# Patient Record
Sex: Female | Born: 1985 | Race: White | Hispanic: No | Marital: Single | Smoking: Former smoker
Health system: Southern US, Community
[De-identification: ages and names within clinical notes are randomized; demographics above are authoritative.]

## PROBLEM LIST (undated history)

## (undated) DIAGNOSIS — F3164 Bipolar disorder, current episode mixed, severe, with psychotic features: Secondary | ICD-10-CM

## (undated) DIAGNOSIS — I10 Essential (primary) hypertension: Secondary | ICD-10-CM

## (undated) DIAGNOSIS — F4310 Post-traumatic stress disorder, unspecified: Secondary | ICD-10-CM

## (undated) HISTORY — PX: INNER EAR SURGERY: SHX679

## (undated) HISTORY — PX: TONSILECTOMY, ADENOIDECTOMY, BILATERAL MYRINGOTOMY AND TUBES: SHX2538

## (undated) HISTORY — PX: OTHER SURGICAL HISTORY: SHX169

---

## 2014-10-18 ENCOUNTER — Encounter: Payer: Self-pay | Admitting: *Deleted

## 2014-10-18 ENCOUNTER — Emergency Department
Admission: EM | Admit: 2014-10-18 | Discharge: 2014-10-19 | Disposition: A | Discharge status: Other Acute Inpatient Hospital | Payer: Medicaid Other | Attending: Emergency Medicine | Admitting: Emergency Medicine

## 2014-10-18 DIAGNOSIS — F4310 Post-traumatic stress disorder, unspecified: Secondary | ICD-10-CM | POA: Insufficient documentation

## 2014-10-18 DIAGNOSIS — F29 Unspecified psychosis not due to a substance or known physiological condition: Secondary | ICD-10-CM | POA: Diagnosis not present

## 2014-10-18 DIAGNOSIS — F1210 Cannabis abuse, uncomplicated: Secondary | ICD-10-CM | POA: Diagnosis not present

## 2014-10-18 DIAGNOSIS — Z3202 Encounter for pregnancy test, result negative: Secondary | ICD-10-CM | POA: Diagnosis not present

## 2014-10-18 DIAGNOSIS — F911 Conduct disorder, childhood-onset type: Secondary | ICD-10-CM | POA: Diagnosis present

## 2014-10-18 DIAGNOSIS — Z87891 Personal history of nicotine dependence: Secondary | ICD-10-CM | POA: Insufficient documentation

## 2014-10-18 DIAGNOSIS — F23 Brief psychotic disorder: Secondary | ICD-10-CM

## 2014-10-18 HISTORY — DX: Post-traumatic stress disorder, unspecified: F43.10

## 2014-10-18 LAB — CBC WITH DIFFERENTIAL/PLATELET
BASOS ABS: 0 10*3/uL (ref 0–0.1)
Basophils Relative: 0 %
EOS ABS: 0.1 10*3/uL (ref 0–0.7)
Eosinophils Relative: 1 %
HCT: 42.1 % (ref 35.0–47.0)
Hemoglobin: 13.9 g/dL (ref 12.0–16.0)
LYMPHS PCT: 27 %
Lymphs Abs: 3.2 10*3/uL (ref 1.0–3.6)
MCH: 28.5 pg (ref 26.0–34.0)
MCHC: 33 g/dL (ref 32.0–36.0)
MCV: 86.1 fL (ref 80.0–100.0)
Monocytes Absolute: 0.8 10*3/uL (ref 0.2–0.9)
Monocytes Relative: 6 %
NEUTROS ABS: 7.8 10*3/uL — AB (ref 1.4–6.5)
Neutrophils Relative %: 66 %
PLATELETS: 260 10*3/uL (ref 150–440)
RBC: 4.89 MIL/uL (ref 3.80–5.20)
RDW: 15.1 % — ABNORMAL HIGH (ref 11.5–14.5)
WBC: 11.9 10*3/uL — AB (ref 3.6–11.0)

## 2014-10-18 LAB — COMPREHENSIVE METABOLIC PANEL
ALBUMIN: 4.6 g/dL (ref 3.5–5.0)
ALK PHOS: 77 U/L (ref 38–126)
ALT: 21 U/L (ref 14–54)
AST: 24 U/L (ref 15–41)
Anion gap: 7 (ref 5–15)
BILIRUBIN TOTAL: 0.5 mg/dL (ref 0.3–1.2)
BUN: 11 mg/dL (ref 6–20)
CHLORIDE: 105 mmol/L (ref 101–111)
CO2: 27 mmol/L (ref 22–32)
Calcium: 9.4 mg/dL (ref 8.9–10.3)
Creatinine, Ser: 0.73 mg/dL (ref 0.44–1.00)
GLUCOSE: 106 mg/dL — AB (ref 65–99)
POTASSIUM: 3.5 mmol/L (ref 3.5–5.1)
Sodium: 139 mmol/L (ref 135–145)
Total Protein: 7.3 g/dL (ref 6.5–8.1)

## 2014-10-18 LAB — URINALYSIS COMPLETE WITH MICROSCOPIC (ARMC ONLY)
Bilirubin Urine: NEGATIVE
Glucose, UA: NEGATIVE mg/dL
HGB URINE DIPSTICK: NEGATIVE
KETONES UR: NEGATIVE mg/dL
Leukocytes, UA: NEGATIVE
Nitrite: NEGATIVE
Protein, ur: NEGATIVE mg/dL
Specific Gravity, Urine: 1.026 (ref 1.005–1.030)
pH: 6 (ref 5.0–8.0)

## 2014-10-18 LAB — POCT PREGNANCY, URINE: PREG TEST UR: NEGATIVE

## 2014-10-18 LAB — URINE DRUG SCREEN, QUALITATIVE (ARMC ONLY)
Amphetamines, Ur Screen: NOT DETECTED
Barbiturates, Ur Screen: NOT DETECTED
Benzodiazepine, Ur Scrn: NOT DETECTED
CANNABINOID 50 NG, UR ~~LOC~~: POSITIVE — AB
COCAINE METABOLITE, UR ~~LOC~~: NOT DETECTED
MDMA (Ecstasy)Ur Screen: NOT DETECTED
Methadone Scn, Ur: NOT DETECTED
Opiate, Ur Screen: NOT DETECTED
PHENCYCLIDINE (PCP) UR S: NOT DETECTED
Tricyclic, Ur Screen: NOT DETECTED

## 2014-10-18 LAB — ETHANOL: Alcohol, Ethyl (B): <5 mg/dL (ref <5)
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
## 2014-10-18 NOTE — ED Notes (Signed)

## 2014-10-18 NOTE — ED Notes (Addendum)
Patient resting comfortably in bed, denies pain or other complaints at this time. Patient calm and cooperative, NAD noted. Patient verbalized contract for safety. Will continue to monitor.

## 2014-10-18 NOTE — ED Notes (Signed)
Patient resting comfortably in bed, watching TV. NAD noted. Patient alert, calm, and cooperative. Will continue to monitor.

## 2014-10-18 NOTE — ED Notes (Signed)
Patient assigned to appropriate care area. Patient oriented to unit/care area: Informed that, for their safety, care areas are designed for safety and monitored by security cameras at all times; and visiting hours explained to patient. Patient verbalizes understanding, and verbal contract for safety obtained. 

## 2014-10-18 NOTE — ED Notes (Signed)

## 2014-10-18 NOTE — ED Notes (Signed)

## 2014-10-18 NOTE — ED Notes (Addendum)
Patient verbalized permission to speak with Briant Sites about patient condition. Briant Sites contact information: Cell #: [phone]

## 2014-10-18 NOTE — ED Notes (Signed)
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
## 2014-10-19 ENCOUNTER — Inpatient Hospital Stay
Admission: EM | Admit: 2014-10-19 | Discharge: 2014-10-27 | DRG: 885 | Disposition: A | Discharge status: Home / Self Care | Payer: Medicaid Other | Attending: Psychiatry | Admitting: Psychiatry

## 2014-10-19 ENCOUNTER — Encounter: Payer: Self-pay | Admitting: *Deleted

## 2014-10-19 DIAGNOSIS — Z9889 Other specified postprocedural states: Secondary | ICD-10-CM | POA: Diagnosis not present

## 2014-10-19 DIAGNOSIS — F302 Manic episode, severe with psychotic symptoms: Secondary | ICD-10-CM | POA: Diagnosis present

## 2014-10-19 DIAGNOSIS — F312 Bipolar disorder, current episode manic severe with psychotic features: Secondary | ICD-10-CM | POA: Diagnosis not present

## 2014-10-19 DIAGNOSIS — I10 Essential (primary) hypertension: Secondary | ICD-10-CM | POA: Diagnosis present

## 2014-10-19 DIAGNOSIS — K5900 Constipation, unspecified: Secondary | ICD-10-CM | POA: Diagnosis present

## 2014-10-19 DIAGNOSIS — Z87891 Personal history of nicotine dependence: Secondary | ICD-10-CM | POA: Diagnosis not present

## 2014-10-19 DIAGNOSIS — F319 Bipolar disorder, unspecified: Secondary | ICD-10-CM | POA: Diagnosis present

## 2014-10-19 DIAGNOSIS — F1220 Cannabis dependence, uncomplicated: Secondary | ICD-10-CM | POA: Diagnosis present

## 2014-10-19 DIAGNOSIS — E669 Obesity, unspecified: Secondary | ICD-10-CM | POA: Diagnosis present

## 2014-10-19 DIAGNOSIS — F29 Unspecified psychosis not due to a substance or known physiological condition: Secondary | ICD-10-CM | POA: Diagnosis not present

## 2014-10-19 LAB — LIPID PANEL
CHOL/HDL RATIO: 5.1 ratio
CHOLESTEROL: 175 mg/dL (ref 0–200)
HDL: 34 mg/dL — ABNORMAL LOW (ref >40)
LDL Cholesterol: 116 mg/dL — ABNORMAL HIGH (ref 0–99)
Triglycerides: 126 mg/dL (ref <150)
VLDL: 25 mg/dL (ref 0–40)

## 2014-10-19 LAB — TSH: TSH: 1.854 u[IU]/mL (ref 0.350–4.500)

## 2014-10-19 MED ORDER — MAGNESIUM HYDROXIDE 400 MG/5ML PO SUSP
30.0000 mL | Freq: Every day | ORAL | Status: DC | PRN
  Administered 2014-10-24 – 2014-10-25 (×2): 30 mL via ORAL
  Filled 2014-10-19 (×2): qty 30

## 2014-10-19 MED ORDER — ALUM & MAG HYDROXIDE-SIMETH 200-200-20 MG/5ML PO SUSP
30.0000 mL | ORAL | Status: DC | PRN
Start: 2014-10-19 — End: 2014-10-27

## 2014-10-19 MED ORDER — LISINOPRIL 20 MG PO TABS
10.0000 mg | ORAL_TABLET | Freq: Every day | ORAL | Status: DC
  Administered 2014-10-19: 10 mg via ORAL

## 2014-10-19 MED ORDER — ACETAMINOPHEN 325 MG PO TABS
650.0000 mg | ORAL_TABLET | Freq: Four times a day (QID) | ORAL | Status: DC | PRN
  Administered 2014-10-24 – 2014-10-25 (×2): 650 mg via ORAL
  Filled 2014-10-19 (×3): qty 2

## 2014-10-19 MED ORDER — LITHIUM CARBONATE ER 300 MG PO TBCR
600.0000 mg | EXTENDED_RELEASE_TABLET | Freq: Every day | ORAL | Status: DC
  Administered 2014-10-19 – 2014-10-20 (×2): 600 mg via ORAL
  Filled 2014-10-19 (×3): qty 2

## 2014-10-19 MED ORDER — LISINOPRIL 20 MG PO TABS
10.0000 mg | ORAL_TABLET | Freq: Every day | ORAL | Status: DC
  Administered 2014-10-20: 10 mg via ORAL
  Filled 2014-10-19: qty 1

## 2014-10-19 MED ORDER — LITHIUM CARBONATE ER 300 MG PO TBCR: 600.0000 mg | EXTENDED_RELEASE_TABLET | Freq: Every day | ORAL | Status: DC

## 2014-10-19 MED ORDER — LISINOPRIL 10 MG PO TABS
ORAL_TABLET | ORAL | Status: AC
  Filled 2014-10-19: qty 1

## 2014-10-19 NOTE — ED Notes (Signed)

## 2014-10-19 NOTE — ED Notes (Signed)
Patient resting comfortably in bed with lights turned off for patient comfort. Respirations even and unlabored, NAD noted at this time.

## 2014-10-19 NOTE — ED Notes (Signed)
BEHAVIORAL HEALTH ROUNDING Patient sleeping: No. Patient alert and oriented: yes Behavior appropriate: Yes.  ; If no, describe:  Nutrition and fluids offered: Yes  Toileting and hygiene offered: Yes  Sitter present: yes Law enforcement present: Yes   Pt returns to ed room 25 from shower. Awaiting psych consult.

## 2014-10-19 NOTE — ED Notes (Signed)

## 2014-10-19 NOTE — ED Notes (Signed)
BEHAVIORAL HEALTH ROUNDING Patient sleeping: Yes.   Patient alert and oriented: yes Behavior appropriate: Yes.  ; If no, describe:  Nutrition and fluids offered: Yes  Toileting and hygiene offered: Yes  Sitter present: yes Law enforcement present: Yes  

## 2014-10-19 NOTE — [facility] Group Notes (Signed)
[facility] Group Notes:  (Nursing/MHT/Case Management/Adjunct)  Date:  10/19/2014  Time:  10:55 PM  Type of Therapy:  Group Therapy  Participation Level:  Active  Participation Quality:  Appropriate  Affect:  Appropriate  Cognitive:  Appropriate  Insight:  Appropriate  Engagement in Group:  Engaged  Modes of Intervention:  n/a  Summary of Progress/Problems:  Jocelyn Martin 10/19/2014, 10:55 PM

## 2014-10-19 NOTE — ED Notes (Signed)

## 2014-10-19 NOTE — ED Notes (Signed)
Patient resting comfortably in bed, lights and TV turned off for comfort. Respirations even and unlabored, patient denies any complaints at this time. NAD noted. Will continue to monitor.

## 2014-10-19 NOTE — ED Notes (Signed)
Pt resting at this time. Still awaiting to see psych md.

## 2014-10-19 NOTE — Tx Team (Signed)
Initial Interdisciplinary Treatment Plan   PATIENT STRESSORS: Financial difficulties Marital or family conflict   PATIENT STRENGTHS: Wellsite geologist fund of knowledge   PROBLEM LIST: Problem List/Patient Goals Date to be addressed Date deferred Reason deferred Estimated date of resolution  Psychosis      Substance Abuse                                                 DISCHARGE CRITERIA:  Improved stabilization in mood, thinking, and/or behavior Motivation to continue treatment in a less acute level of care  PRELIMINARY DISCHARGE PLAN: Outpatient therapy Return to previous living arrangement  PATIENT/FAMIILY INVOLVEMENT: This treatment plan has been presented to and reviewed with the patient, Jocelyn Martin.  The patient and family have been given the opportunity to ask questions and make suggestions.  Jocelyn Martin, Sarajane Marek 10/19/2014, 6:19 PM

## 2014-10-19 NOTE — Plan of Care (Signed)
Problem: Ineffective individual coping Goal: STG: Patient will remain free from self harm Outcome: Progressing Pt safe on the unit     
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
## 2014-10-19 NOTE — ED Notes (Signed)
Awaiting bed assignment in the BHU downstairs.
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
## 2014-10-19 NOTE — ED Notes (Signed)
Patient resting comfortably in bed, lights turned off for patient comfort. Will continue to monitor. NAD at this time.

## 2014-10-19 NOTE — ED Notes (Addendum)
BEHAVIORAL HEALTH ROUNDING Patient sleeping: No. Patient alert and oriented: yes Behavior appropriate: Yes.  ; If no, describe:  Nutrition and fluids offered: Yes  Toileting and hygiene offered: Yes  Sitter present: yes Law enforcement present: Yes   Pt given lunch tray.  Dr Toni Amend in to see pt at this time.

## 2014-10-19 NOTE — ED Notes (Signed)
Visitor at bedside with security monitoring.

## 2014-10-19 NOTE — ED Notes (Signed)
Pt requests taking a shower today. Will arrange for pt to go to [facility] to take shower.

## 2014-10-19 NOTE — ED Notes (Signed)
BEHAVIORAL HEALTH ROUNDING Patient sleeping: No. Patient alert and oriented: yes Behavior appropriate: Yes.  ; If no, describe:  Nutrition and fluids offered: Yes  Toileting and hygiene offered: Yes  Sitter present: yes Law enforcement present: Yes  

## 2014-10-19 NOTE — ED Notes (Signed)
Pt escorted over to [facility] to shower via Psychologist, counselling. Pt is under IVC.

## 2014-10-19 NOTE — ED Notes (Signed)
Report called to [employer] in [place] med unit.

## 2014-10-19 NOTE — ED Notes (Signed)
Patient resting comfortably in bed, lights and TV turned off for patient comfort. Respirations even and unlabored, calm and cooperative, no complaints at this time. NAD noted. Will continue to monitor.
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
## 2014-10-20 ENCOUNTER — Inpatient Hospital Stay: Payer: Medicaid Other

## 2014-10-20 DIAGNOSIS — F1220 Cannabis dependence, uncomplicated: Secondary | ICD-10-CM

## 2014-10-20 DIAGNOSIS — I10 Essential (primary) hypertension: Secondary | ICD-10-CM

## 2014-10-20 LAB — HEMOGLOBIN A1C: Hgb A1c MFr Bld: 5.2 % (ref 4.0–6.0)

## 2014-10-20 MED ORDER — ARIPIPRAZOLE 10 MG PO TABS
10.0000 mg | ORAL_TABLET | Freq: Every day | ORAL | Status: DC
  Administered 2014-10-20 – 2014-10-21 (×2): 10 mg via ORAL
  Filled 2014-10-20 (×2): qty 1

## 2014-10-20 MED ORDER — AMLODIPINE BESYLATE 5 MG PO TABS
5.0000 mg | ORAL_TABLET | Freq: Every day | ORAL | Status: DC
  Administered 2014-10-20 – 2014-10-21 (×2): 5 mg via ORAL
  Filled 2014-10-20 (×2): qty 1

## 2014-10-20 MED ORDER — HYDRALAZINE HCL 25 MG PO TABS
25.0000 mg | ORAL_TABLET | Freq: Three times a day (TID) | ORAL | Status: DC | PRN
  Administered 2014-10-21: 25 mg via ORAL
  Filled 2014-10-20: qty 1

## 2014-10-20 NOTE — Progress Notes (Signed)
D: Pt denies SI/HI/AVH. Pt is pleasant and cooperative.  Pt stated GOD brought her here to replenish the world. Pt states feeling a lot better today than 2 days ago. Med and group compliant. Laughing and interacting with peers.  A: Pt was offered support and encouragement. Pt was given scheduled medications. Pt was encourage to attend groups. Q 15 minute checks were done for safety.   R:Pt attends groups and interacts well with peers and staff. Pt is taking medication. Pt has no complaints at this time .Pt receptive to treatment and safety maintained on unit.
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
## 2014-10-20 NOTE — B[facility] Group Notes (Signed)
[facility] Group Notes:  (Nursing/MHT/Case Management/Adjunct)  Date:  10/20/2014  Time:  12:21 PM  Type of Therapy:  Psychoeducational Skills  Participation Level:  Active  Participation Quality:  Appropriate  Affect:  Appropriate  Cognitive:  Appropriate  Insight:  Appropriate  Engagement in Group:  Supportive  Modes of Intervention:  Support  Summary of Progress/Problems:  Jocelyn Martin 10/20/2014, 12:21 PM

## 2014-10-20 NOTE — Progress Notes (Signed)
Recreation Therapy Notes  Date: 06.22.16 Time: 3:00 pm Location: Craft Room  Group Topic: Self-esteem  Goal Area(s) Addresses:  Patient will be able to identify benefit of self-esteem. Patient will be able to identify ways to increase self-esteem.  Behavioral Response: Attentive, Interactive  Intervention: Self-Portrait  Activity: Patients were instructed to draw their self-portrait, write something positive about themselves and their peers, and draw their self-portrait after they read the positive things peers wrote.   Education:LRT educated patients on ways to increase their self-esteem.   Education Outcome: Acknowledges education/In group clarification offered  Clinical Observations/Feedback: Patient drew one self-portrait. Patient left group at approximately 3:15 pm with PA student. Patient returned to group at approximately 3:40 pm. Patient did not contribute to group discussion.  Jacquelynn Cree, LRT/CTRS 10/20/2014 4:41 PM
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
## 2014-10-20 NOTE — Plan of Care (Signed)
Problem: Ineffective individual coping Goal: STG: Patient will remain free from self harm Outcome: Progressing No self harm reported or observed     

## 2014-10-21 LAB — RPR: RPR: NONREACTIVE

## 2014-10-21 LAB — HIV ANTIBODY (ROUTINE TESTING W REFLEX): HIV Screen 4th Generation wRfx: NONREACTIVE

## 2014-10-21 MED ORDER — ARIPIPRAZOLE 15 MG PO TABS
15.0000 mg | ORAL_TABLET | Freq: Every day | ORAL | Status: DC
  Administered 2014-10-22 – 2014-10-23 (×2): 15 mg via ORAL
  Filled 2014-10-21 (×2): qty 1

## 2014-10-21 MED ORDER — LITHIUM CARBONATE ER 450 MG PO TBCR
450.0000 mg | EXTENDED_RELEASE_TABLET | Freq: Two times a day (BID) | ORAL | Status: DC
  Administered 2014-10-21 – 2014-10-27 (×12): 450 mg via ORAL
  Filled 2014-10-21 (×12): qty 1

## 2014-10-21 MED ORDER — AMLODIPINE BESYLATE 5 MG PO TABS
5.0000 mg | ORAL_TABLET | Freq: Once | ORAL | Status: AC
  Administered 2014-10-21: 5 mg via ORAL
  Filled 2014-10-21: qty 1

## 2014-10-21 MED ORDER — AMLODIPINE BESYLATE 10 MG PO TABS
10.0000 mg | ORAL_TABLET | Freq: Every day | ORAL | Status: DC
  Administered 2014-10-22 – 2014-10-27 (×6): 10 mg via ORAL
  Filled 2014-10-21 (×6): qty 1

## 2014-10-21 NOTE — B[facility] Group Notes (Signed)
[facility] Group Notes:  (Nursing/MHT/Case Management/Adjunct)  Date:  10/21/2014  Time:  3:54 PM  Type of Therapy:  Movement Therapy  Participation Level:  Active  Participation Quality:  Appropriate and Attentive  Affect:  Appropriate  Cognitive:  Alert, Appropriate and Oriented  Insight:  Appropriate  Engagement in Group:  Engaged  Modes of Intervention:  Activity  Summary of Progress/Problems:  Jocelyn Martin 10/21/2014, 3:54 PM

## 2014-10-21 NOTE — Progress Notes (Signed)
Elevated BP managed by additional Norvasc 5 mg, lithium level with am lab draw. Patient continue to happy, denied SI/HI, no AV/H, will continue with POC.

## 2014-10-21 NOTE — Plan of Care (Signed)
Problem: [facility] Participation in Recreation Therapeutic Interventions Goal: STG-Patient will identify at least five coping skills for ** STG: Coping Skills - Within 4 treatment sessions, patient will verbalize at least 5 coping skills for anger in each of 2 treatment sessions to increase anger management skills post d/c.  Outcome: Progressing Treatment Session 1; Completed 1 out of 2: At approximately 9:05 am, LRT met with patient in craft room. Patient verbalized 5 coping skills for anger. Patient verbalized what triggers her to get angry, how her body responds to anger, and how she is going to remember to use her healthy coping skills. LRT provided suggestions as well. 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 Patient reported treatment session was helpful.  Leonette Monarch, LRT/CTRS 06.23.16 1:19 pm Goal: STG-Other Recreation Therapy Goal (Specify) STG: Stress Management - Within 5 treatment sessions, patient will demonstrate at least one stress management technique in each of 2 treatment sessions to increase stress management skills post d/c.  Outcome: Progressing Treatment Session 1; Completed 0 out of 2: At approximately 9:05 am, LRT met with patient in craft room. LRT educated and provided patient with handouts on stress management techniques. Patient verbalized understanding. LRT encouraged patient to read over and practice the stress management techniques.  Leonette Monarch, LRT/CTRS 06.23.16 1:20 pm
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
## 2014-10-21 NOTE — [facility] Group Notes (Signed)
[facility] Group Notes:  (Nursing/MHT/Case Management/Adjunct)  Date:  10/21/2014  Time:  9:19 AM  Type of Therapy:  Community Meeting   Participation Level:  Active  Participation Quality:  Appropriate, Attentive and Sharing  Affect:  Appropriate  Cognitive:  Alert, Appropriate and Oriented  Insight:  Appropriate  Engagement in Group:  Engaged  Modes of Intervention:  Discussion  Summary of Progress/Problems:  Jocelyn Martin Jocelyn Martin 10/21/2014, 9:19 AM
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
## 2014-10-21 NOTE — Plan of Care (Signed)
Problem: Ineffective individual coping Goal: LTG: Patient will report a decrease in negative feelings Outcome: Progressing Upbeat, interacting well with others and participating in group activities, encouraged to express feelings, has maintained a safe environment and prevented harm to self and others

## 2014-10-21 NOTE — Progress Notes (Signed)
D: Pt denies SI/HI/AVH. Pt is pleasant and cooperative. Pt stated she feels better after resting, she appears less anxious, not interacting with peers. Minimal communication with staff.  A: Pt was offered support and encouragement. Pt was given scheduled medications. Pt was encouraged to attend groups. Q 15 minute checks were done for safety.  R:Pt attends groups and interacts well with peers and staff. Pt is taking medication. Pt has no complaints.Pt receptive to treatment and safety maintained on unit.

## 2014-10-21 NOTE — [facility] Group Notes (Signed)
[facility] Group Notes:  (Nursing/MHT/Case Management/Adjunct)  Date:  10/21/2014  Time:  4:27 AM  Type of Therapy:  Music Therapy  Participation Level:  Active  Participation Quality:  Appropriate and Attentive  Affect:  Appropriate  Cognitive:  Appropriate and Oriented  Insight:  Good  Engagement in Group:  Engaged  Modes of Intervention:  Discussion, Education and Exploration  Summary of Progress/Problems:  Foy Guadalajara 10/21/2014, 4:27 AM

## 2014-10-21 NOTE — Progress Notes (Signed)
Recreation Therapy Notes  Date: 06.23.16 Time: 3:00 pm Location: Craft Room  Group Topic: Leisure Education  Goal Area(s) Addresses:  Patient will identify one positive leisure activity.  Behavioral Response: Arrived late  Intervention: Leisure Time  Activity: Patients were instructed to write down one positive leisure activity. Patients were instructed to completed "Leisure Time Clock" worksheet. Patients were instructed to list 10 positive emotions as a group. Patients were instructed to match the leisure activities with the emotions.   Education: LRT educated patient on what was needed to participate in leisure.   Education Outcome: Acknowledges education/In group clarification offered  Clinical Observations/Feedback: Patient arrived to group at approximately 3:35 pm. Patient did not participate in activity or group discussion.  Jacquelynn Cree, LRT/CTRS 10/21/2014 4:40 PM

## 2014-10-21 NOTE — Plan of Care (Signed)
Problem: Alteration in thought process Goal: LTG-Patient behavior demonstrates decreased signs psychosis (Patient behavior demonstrates decreased signs of psychosis to the point the patient is safe to return home and continue treatment in an outpatient setting.)  Outcome: Progressing Interacting appropriately with peers and staff.

## 2014-10-22 NOTE — [facility] Group Notes (Signed)
[facility] LCSW Aftercare Discharge Planning Group Note  10/22/2014 11:01 AM  Participation Quality:  Appropriate and Attentive  Affect:  Appropriate  Cognitive:  Alert, Appropriate and Oriented  Insight:  Engaged  Engagement in Group:  Engaged  Modes of Intervention:  Education, Socialization and Support  Summary of Progress/Problems: Patient attended and participated in group appropriately. Patient shared that her SMART goal is to "continue medications and working on being positive and interacting more".   Beryl Meager T 10/22/2014, 11:01 AM

## 2014-10-22 NOTE — Plan of Care (Signed)
Problem: [facility] Participation in Recreation Therapeutic Interventions Goal: STG-Patient will identify at least five coping skills for ** STG: Coping Skills - Within 4 treatment sessions, patient will verbalize at least 5 coping skills for anger in each of 2 treatment sessions to increase anger management skills post d/c.  Outcome: Completed/Met Date Met:  10/22/14 Treatment Session 2; Completed 2 out of 2: At approximately 12:40 pm, LRT met with patient in patient room. Patient verbalized 5 coping skills for anger. LRT encouraged patient to work on her coping skills.  Leonette Monarch, LRT/CTRS 06.24.16 2:22 pm Goal: STG-Other Recreation Therapy Goal (Specify) STG: Stress Management - Within 5 treatment sessions, patient will demonstrate at least one stress management technique in each of 2 treatment sessions to increase stress management skills post d/c.  Outcome: Progressing Treatment Session 2; Completed 1 out of 2: At approximately 12:40 pm, patient reported she read over and practiced the stress management techniques. Patient demonstrated one stress management technique. LRT encouraged patient to continue practicing the stress management techniques.  Leonette Monarch, LRT/CTRS 06.24.16 2:23 pm
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
## 2014-10-22 NOTE — Plan of Care (Signed)
Problem: Ineffective individual coping Goal: LTG: Patient will report a decrease in negative feelings Outcome: Progressing Denies any depression, rates as 0/10

## 2014-10-22 NOTE — Progress Notes (Signed)
Denies depression, SI, HI, AVH or any anxiety.  States that she feels good and just trying to learn about her disease and medications.  Verbalized goal is to stay positive, interact with peers and attend groups.  Safety maintained.

## 2014-10-22 NOTE — [facility] Group Notes (Signed)
[facility] Group Notes:  (Nursing/MHT/Case Management/Adjunct)  Date:  10/22/2014  Time:  11:40 PM  Type of Therapy:  Group Therapy  Participation Level:  Active  Participation Quality:  Appropriate and Attentive  Affect:  Appropriate  Cognitive:  Alert and Appropriate  Insight:  Appropriate  Engagement in Group:  Engaged  Modes of Intervention:  Discussion  Summary of Progress/Problems:  Adin Laker Joy Kelsen [place] 10/22/2014, 11:40 PM

## 2014-10-22 NOTE — [facility] Group Notes (Signed)
[facility] Group Notes:  (Nursing/MHT/Case Management/Adjunct)  Date:  10/22/2014  Time:  4:22 AM  Type of Therapy:  Group Therapy  Participation Level:  Active  Participation Quality:  Appropriate  Affect:  Appropriate  Cognitive:  Appropriate  Insight:  Appropriate  Engagement in Group:  Engaged  Modes of Intervention:  n/a  Summary of Progress/Problems:  Jocelyn Martin 10/22/2014, 4:22 AM

## 2014-10-22 NOTE — [facility] Group Notes (Signed)
[facility] Group Notes:  (Nursing/MHT/Case Management/Adjunct)  Date:  10/22/2014  Time:  11:49 AM  Type of Therapy:  Group Therapy  Participation Level:  Minimal  Participation Quality:  Appropriate and Sharing  Affect:  Appropriate  Cognitive:  Alert, Appropriate and Oriented  Insight:  Good  Engagement in Group:  Improving  Modes of Intervention:  Activity  Summary of Progress/Problems:  Jocelyn Martin Jocelyn Martin Jocelyn Martin Jocelyn Martin 10/22/2014, 11:49 AM

## 2014-10-22 NOTE — [facility] Group Notes (Signed)
[facility] LCSW Group Therapy  10/22/2014 4:10 PM  Type of Therapy:  Group Therapy  Participation Level:  Minimal  Participation Quality:  Attentive  Affect:  Appropriate  Cognitive:  Alert  Insight:  Limited  Engagement in Therapy:  Limited  Modes of Intervention:  Discussion, Education, Problem-solving, Socialization and Support  Summary of Progress/Problems:Feelings around relapse and recovery: Pt will discuss emotions they experience before and after a relapse. Pt will be encouraged to explore feelings around recovery. Jocelyn Martin discussed activities she used to cope with stresses in her life. She was supportive of other group members.   [employer] MSW, LCSWA 10/22/2014, 4:10 PM

## 2014-10-22 NOTE — Progress Notes (Signed)
D: Pt is awake and active in the milieu this evening. Pt mood is appropriate and her affect is flat. Pt had questions about medication regimen in relation to her Bipolar disorder. Pt acknowledges the need for medications and seems to be vested in tx.   A: Writer explained indications for medications and encouraged compliance.  R: Pt is receptive to feedback and is pleasant and cooperative with staff.

## 2014-10-22 NOTE — Progress Notes (Signed)
Recreation Therapy Notes  Date: 06.24.16 Time: 3:00 pm Location: Craft Room  Group Topic: Problem Solving, Communication, Teamwork  Goal Area(s) Addresses:  Patient will work in teams towards shared goal. Patient will verbalize skills needed to make activity successful. Patient will verbalize benefit of using skills identified to reach post d/c goals.  Behavioral Response: Attentive, Interactive  Intervention: Landing Pad  Activity: Patients were given 15 straws and approximately 2.5 feet of tape and instructed to build a landing pad to catch a golf ball.  Education: LRT educated patients on why communication, teamwork, and problem solving are important.  Education Outcome: Acknowledges education/In group clarification offered   Clinical Observations/Feedback: Patient worked with team to build landing pad. Patient used effective teamwork, problem solving, and communication. Patient contributed to group discussion by stating why her team was successful, what skills she used during group, why these skills are important, and how she is going to use these skills once she is d/c.  Jacquelynn Cree, LRT/CTRS 10/22/2014 5:15 PM

## 2014-10-23 DIAGNOSIS — F312 Bipolar disorder, current episode manic severe with psychotic features: Secondary | ICD-10-CM

## 2014-10-23 MED ORDER — ARIPIPRAZOLE 15 MG PO TABS
15.0000 mg | ORAL_TABLET | Freq: Every day | ORAL | Status: DC
  Administered 2014-10-24 – 2014-10-26 (×3): 15 mg via ORAL
  Filled 2014-10-23 (×3): qty 1

## 2014-10-23 NOTE — Plan of Care (Signed)
Problem: Alteration in thought process Goal: STG-Patient does not respond to command hallucinations Outcome: Progressing Denies hallucinations.  No signs of responding to any stimuli

## 2014-10-23 NOTE — [facility] Group Notes (Signed)
[facility] LCSW Group Therapy  10/23/2014 3:31 PM  Type of Therapy:  Group Therapy  Participation Level:  Minimal  Participation Quality:  Appropriate and Supportive  Affect:  Appropriate  Cognitive:  Alert  Insight:  Improving  Engagement in Therapy:  Improving  Modes of Intervention:  Education, Exploration, Socialization and Support  Summary of Progress/Problems:Pt will identify unhealthy thoughts and how they impact their emotions and behavior. Pt will be encouraged to discuss these thoughts, emotions and behaviors with the group. Pt will be asked to pick a positive affirmation and discuss what emotion they experienced.  Jocelyn Martin attended group and stayed the entire time. She identified "you're not good enough" and "why me" as unhealthy thoughts. When she has these thoughts, she tends to isolate.   Jocelyn Martin Jocelyn Martin MSW, LCSWA 10/23/2014, 3:31 PM

## 2014-10-23 NOTE — Progress Notes (Signed)
Enthusiastic about progress that she is making.  Medication and group compliant.  Interacting well with peers.  Denies any SI, HI, AVH or anxiety.  Safe environment maintained.

## 2014-10-23 NOTE — Plan of Care (Signed)
Problem: Alteration in thought process Goal: LTG-Patient behavior demonstrates decreased signs psychosis (Patient behavior demonstrates decreased signs of psychosis to the point the patient is safe to return home and continue treatment in an outpatient setting.)  Outcome: Progressing Denies SI/HI/AVH no bizarre behavior noted , interacting appropriately with peers and staff.

## 2014-10-23 NOTE — Plan of Care (Signed)
Problem: Alteration in thought process Goal: LTG-Patient behavior demonstrates decreased signs psychosis (Patient behavior demonstrates decreased signs of psychosis to the point the patient is safe to return home and continue treatment in an outpatient setting.)  Outcome: Progressing No signs of pyschosis.  Alert and oriented.

## 2014-10-23 NOTE — Progress Notes (Signed)
D: Pt denies SI/HI/AVH. Patient's affect is flat but brightens upon approach, patient is pleasant and cooperative. Pt stated "she feels better" appears less anxious and he is interacting with peers and staff appropriately.  A: Pt was offered support and encouragement. Pt was given scheduled medications. Pt was encouraged to attend groups. Q 15 minute checks were done for safety.  R: Pt attends group and interacts appropriately with peers and staff. Pt is taking medication. Pt has no complaints.Pt receptive to treatment and safety maintained on unit.

## 2014-10-23 NOTE — Plan of Care (Signed)
Problem: Alteration in thought process Goal: LTG-Patient verbalizes understanding importance med regimen (Patient verbalizes understanding of importance of medication regimen and need to continue outpatient care.)  Outcome: Progressing Takes medications as prescribed.  Verbalized that she understands the importance of taking medications and staying on them.

## 2014-10-23 NOTE — [facility] Group Notes (Signed)
[facility] Group Notes:  (Nursing/MHT/Case Management/Adjunct)  Date:  10/23/2014  Time:  2:29 PM  Type of Therapy:  Group Therapy  Participation Level:  Active  Participation Quality:  Appropriate  Affect:  Appropriate  Cognitive:  Alert and Appropriate  Insight:  Appropriate  Engagement in Group:  Improving  Modes of Intervention:  Problem-solving  Summary of Progress/Problems:  Jocelyn Martin 10/23/2014, 2:29 PM
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
## 2014-10-24 MED ORDER — BISACODYL 5 MG PO TBEC
5.0000 mg | DELAYED_RELEASE_TABLET | Freq: Every day | ORAL | Status: DC | PRN
  Administered 2014-10-25: 5 mg via ORAL
  Filled 2014-10-24 (×3): qty 1

## 2014-10-24 NOTE — [facility] Suicide Risk Assessment (Signed)
[facility] INPATIENT:  Family/Significant Other Suicide Prevention Education  Suicide Prevention Education:  Education Completed; Jocelyn Martin [phone]) has been identified by the patient as the family member/significant other with whom the patient will be residing, and identified as the person(s) who will aid the patient in the event of a mental health crisis (suicidal ideations/suicide attempt).  With written consent from the patient, the family member/significant other has been provided the following suicide prevention education, prior to the and/or following the discharge of the patient.  The suicide prevention education provided includes the following:  Suicide risk factors  Suicide prevention and interventions  National Suicide Hotline telephone number  [facility] assessment telephone number  [facility] Emergency Assistance 911  [facility] and/or Residential Mobile Crisis Unit telephone number  Request made of family/significant other to:  Remove weapons (e.g., guns, rifles, knives), all items previously/currently identified as safety concern.    Remove drugs/medications (over-the-counter, prescriptions, illicit drugs), all items previously/currently identified as a safety concern.  The family member/significant other verbalizes understanding of the suicide prevention education information provided.  The family member/significant other agrees to remove the items of safety concern listed above. Mr. Jocelyn Martin states guns are no longer in the house.   Jocelyn Martin, MSW, LCSWA 10/24/2014, 9:32 AM

## 2014-10-24 NOTE — [facility] Group Notes (Signed)
[facility] LCSW Group Therapy  10/24/2014 3:55 PM  Type of Therapy:  Group Therapy  Participation Level:  Active  Participation Quality:  Attentive  Affect:  Appropriate  Cognitive:  Appropriate  Insight:  Improving  Engagement in Therapy:  Improving  Modes of Intervention:  Discussion, Education, Role-play, Socialization and Support  Summary of Progress/Problems:Healthy Communication: Pt will discuss the importance of communication. They will be encouraged to share examples of unhealthy communication they have experienced. Also, they will be encouraged to provide strategies for healthy communication.  Jocelyn Martin states she isolates herself when she is upset instead of communicating her emotions. She tends to bottle them up. She hopes to express her needs and emotions to her significant other after discharge.    Jocelyn Martin Quenisha Lovins MSW, LCSWA  10/24/2014, 3:55 PM

## 2014-10-24 NOTE — Progress Notes (Signed)
Patient improving in structured environment. No evidence of psychotic thinking or other s/s mania. Patient is visible on the unit and social with select peers. Able to discuss feelings with staff. Patient anticipating discharge soon and is aware of need for continued treatment as an outpatient.  Will continue [per current treatment plan, monitor mood, mental status, maintain safety precautions.

## 2014-10-24 NOTE — Progress Notes (Signed)
Interacting with peers in dayroom at onset of shift. Was medication compliant and hopeful of progress and discharge soon. Denies AVH, SI, HI. Was medication compliant and retreated to room for rest at 2130.
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
## 2014-10-25 NOTE — [facility] Group Notes (Signed)
[facility] Group Notes:  (Nursing/MHT/Case Management/Adjunct)  Date:  10/25/2014  Time:  5:06 AM  Type of Therapy:  Evening Wrap-up Group/Outside  Participation Level:  Active  Participation Quality:  Appropriate  Affect:  Excited  Cognitive:  Appropriate  Insight:  Good  Engagement in Group:  Engaged  Modes of Intervention:  Activity  Summary of Progress/Problems:  [employer] 10/25/2014, 5:06 AM

## 2014-10-25 NOTE — Progress Notes (Signed)
Patient denies depression & suicidal ideation.Appropriate with staff & peers.Compliant with groups.Attended groups.She was complaining about constipation.New orders carried out.Appetite good.

## 2014-10-25 NOTE — Progress Notes (Signed)
D: Patient denies SI/HI/AVH. Patient affect and mood are appropriate.  Patient did attend evening group. Patient visible on the milieu. No distress noted. A: Support and encouragement offered. Scheduled medications given to pt. Q 15 min checks continued for patient safety. R: Patient receptive. Patient remains safe on the unit.

## 2014-10-25 NOTE — Plan of Care (Signed)
Problem: Alteration in thought process Goal: LTG-Patient verbalizes understanding importance med regimen (Patient verbalizes understanding of importance of medication regimen and need to continue outpatient care.)  Outcome: Progressing Patient compliant with meds.     

## 2014-10-25 NOTE — [facility] Group Notes (Signed)
[facility]  10/25/2014 5:48 PM  Participation Quality:  Appropriate  Affect:  Appropriate  Cognitive:  Appropriate  Insight:  Engaged  Engagement in Group:  Engaged  Modes of Intervention:  Discussion, Education, Exploration and Socialization  Summary of Progress/Problems: Patient was a great support in discussion and disclosure and support. She had a lot of great ideas to reduce stress and improve balance in your life.  Jocelyn Martin, Jocelyn Martin 10/25/2014, 5:48 PM
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
## 2014-10-25 NOTE — [facility] Group Notes (Addendum)
[facility]  10/25/2014 5:47 PM  Participation Quality:  Appropriate  Affect:  Appropriate  Cognitive:  Appropriate  Insight:  Engaged  Engagement in Group:  Engaged  Modes of Intervention:  Discussion  Summary of Progress/Problems:  Jocelyn Martin, Jocelyn Martin 10/25/2014, 5:47 [facility]   10/25/2014 5:46 PM  Participation Quality:    Mood/Affect:  Appropriate  Depression Rating:    Anxiety Rating:    Thoughts of Suicide:  Negative Will you contract for safety?   Yes  Current AVH:  No  Plan for Discharge/Comments:  na  Transportation Means: na  Supports:na  Jocelyn Martin

## 2014-10-25 NOTE — Plan of Care (Signed)
Problem: [facility] Participation in Recreation Therapeutic Interventions Goal: STG-Other Recreation Therapy Goal (Specify) STG: Stress Management - Within 5 treatment sessions, patient will demonstrate at least one stress management technique in each of 2 treatment sessions to increase stress management skills post d/c.  Outcome: Progressing Treatment Session 3; Completed 1 out of 2: At approximately 12:25 pm, LRT met with patient in patient room. Patient reported she had not been practicing the stress management techniques. Patient was able to walk LRT through the worksheets, but she did not practice the steps. LRT encouraged patient to continue practicing the stress management techniques.  Leonette Monarch, LRT/CTRS 06.27.16 2:09 pm

## 2014-10-25 NOTE — Progress Notes (Signed)
Recreation Therapy Notes  Date: 06.27.16 Time: 3:00 pm Location: Craft Room  Group Topic: Wellness  Goal Area(s) Addresses:  Patient will identify at least one item per dimension of health. Patient will examine areas they are deficient.  Behavioral Response: Attentive, Interactive  Intervention: 6 Dimensions of Health  Activity: Patients were given a worksheet with the definitions of the 6 dimensions of health and instructed to read the worksheet. Patients were given a worksheet with the 6 dimensions on it and instructed to list at least one thing they are currently doing in each category.  Education: LRT educated patient on the 6 dimensions of health   Education Outcome: In group clarification offered  Clinical Observations/Feedback: Patient completed activity by listing three things in each category. Patient contributed to group discussion by stating what categories she was giving enough attention to, what categories she was not giving enough attention to, how she can increase certain categories, and how this activity related to admission and d/c.  [name],Cauy Melody M, LRT/CTRS 10/25/2014 4:41 PM

## 2014-10-26 MED ORDER — LITHIUM CARBONATE ER 450 MG PO TBCR: 450.0000 mg | EXTENDED_RELEASE_TABLET | Freq: Two times a day (BID) | ORAL | Status: DC

## 2014-10-26 MED ORDER — ARIPIPRAZOLE 15 MG PO TABS: 15.0000 mg | ORAL_TABLET | Freq: Every day | ORAL | Status: DC

## 2014-10-26 MED ORDER — AMLODIPINE BESYLATE 10 MG PO TABS: 10.0000 mg | ORAL_TABLET | Freq: Every day | ORAL | Status: DC

## 2014-10-26 MED ORDER — PSEUDOEPHEDRINE HCL ER 120 MG PO TB12
120.0000 mg | ORAL_TABLET | Freq: Two times a day (BID) | ORAL | Status: DC
  Administered 2014-10-26 – 2014-10-27 (×2): 120 mg via ORAL
  Filled 2014-10-26 (×4): qty 1

## 2014-10-26 MED ORDER — LORATADINE 10 MG PO TABS
10.0000 mg | ORAL_TABLET | Freq: Every day | ORAL | Status: DC
Start: 2014-10-26 — End: 2014-10-27
  Administered 2014-10-26 – 2014-10-27 (×2): 10 mg via ORAL
  Filled 2014-10-26 (×2): qty 1

## 2014-10-26 MED ORDER — LORATADINE 10 MG PO TABS: 10.0000 mg | ORAL_TABLET | Freq: Every day | ORAL | Status: DC

## 2014-10-26 NOTE — Plan of Care (Signed)
Problem: [facility] Participation in Recreation Therapeutic Interventions Goal: STG-Other Recreation Therapy Goal (Specify) STG: Stress Management - Within 5 treatment sessions, patient will demonstrate at least one stress management technique in each of 2 treatment sessions to increase stress management skills post d/c.  Outcome: Completed/Met Date Met:  10/26/14 Treatment Session 4; Completed 2 out of 2: At approximately 9:15 am, LRT met with patient in craft room. Patient demonstrated one stress management technique. LRT encouraged patient to continue practicing the stress management techniques.  Leonette Monarch, LRT/CTRS 06.28.16 11:40 am
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
## 2014-10-26 NOTE — Plan of Care (Signed)
Problem: Ineffective individual coping Goal: STG: Pt will be able to identify effective and ineffective STG: Pt will be able to identify effective and ineffective coping patterns  Outcome: Progressing Pt able to voice effective coping patterns Goal: STG: Patient will remain free from self harm Outcome: Progressing No self harm reported or observed

## 2014-10-26 NOTE — Progress Notes (Signed)
D: Patient denies SI/HI/AVH. Patient affect is appropriate. Mood is pleasant. Patient interaction with staff was appropriate. Patient cooperative. Med and group compliant.  Patient visible on the milieu. No distress noted. A: Support and encouragement offered.  Q 15 min checks continued for patient safety. R: Patient receptive. Patient remains safe on the unit.

## 2014-10-26 NOTE — Progress Notes (Signed)
Recreation Therapy Notes  Date: 06.28.16 Time: 3:00 pm Location: Craft Room  Group Topic: Problem Solving, Teamwork, Communication  Goal Area(s) Addresses:  Patient will effectively work with peer towards shared goal. Patient will identify skills used to make activity successful. Patient will identify benefit of using group skills effectively post d/c.  Behavioral Response: Attentive, Interactive, Disruptive  Intervention: Glass blower/designeripe Cleaner Tower  Activity: Patients were instructed to build a free standing tower with 15 pipe cleaners. Patients were given 2 minutes to strategize. After patients had been building for approximately 5 minutes, LRT instructed patients to put their dominate hand behind their back. After approximately another 5 minutes of building, LRT instructed patients that they could not talk to each other.  Education:LRT educated patient on why communication, problem solving, and teamwork is important.   Education Outcome: Acknowledges education/In group clarification offered   Clinical Observations/Feedback: Patient worked with team to build tower. Patient used effective communication, teamwork, and problem solving. Patient contributed to group discussion by stating how her team used effective communication, teamwork, and problem solving, and how these skills are related to building a healthy support system. Patient had side conversations during group discussion. LRT redirected patient. Patient complied.  [name],Io Dieujuste M, LRT/CTRS 10/26/2014 4:32 PM

## 2014-10-26 NOTE — [facility] Group Notes (Signed)
[facility] Group Notes:  (Nursing/MHT/Case Management/Adjunct)  Date:  10/26/2014  Time:  12:09 AM  Type of Therapy:  wrap up group  Participation Level:  Active  Participation Quality:  Appropriate  Affect:  Appropriate  Cognitive:  Appropriate  Insight:  Appropriate  Engagement in Group:  Engaged  Modes of Intervention:  Activity  Summary of Progress/Problems:  Burt EkJanice Marie Woody Kronberg 10/26/2014, 12:09 AM

## 2014-10-26 NOTE — [facility] Group Notes (Signed)
[facility] Group Notes:  (Nursing/MHT/Case Management/Adjunct)  Date:  10/26/2014  Time:  2:14 PM  Type of Therapy:  Psychoeducational Skills  Participation Level:  Active  Participation Quality:  Appropriate and Attentive  Affect:  Appropriate  Cognitive:  Alert and Appropriate  Insight:  Appropriate  Engagement in Group:  Engaged  Modes of Intervention:  Discussion and Education  Summary of Progress/Problems:  Lynelle SmokeCara [facility] 10/26/2014, 2:14 PM
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
## 2014-10-26 NOTE — Progress Notes (Signed)
D: Patient denies SI/HI/AVH.   Patient affect is appropriate. Mood is pleasant.  Patient interaction with staff was appropriate. Patient cooperative. Patient did attend evening group. Patient visible on the milieu. No distress noted. A: Support and encouragement offered. Scheduled medications given to pt. Q 15 min checks continued for patient safety. R: Patient receptive. Patient remains safe on the unit.

## 2014-10-27 LAB — LITHIUM LEVEL: Lithium Lvl: 0.53 mmol/L — ABNORMAL LOW (ref 0.60–1.20)

## 2014-10-27 NOTE — Progress Notes (Signed)
D: Pt denies SI/HI/AVH. Pt is not having command hallucination, not responding to internal stimuli. she is pleasant and cooperative. Pt stated she feels better and is ready for discharge. She appears less anxious and she is interacting with peers and staff appropriately.  A: Pt was offered support and encouragement, given scheduled medications and encouraged to attend groups. Q 15 minute checks were done for safety.  R: Pt attends groups and interacts well with peers and staff. Patient is compliant with medication,has no complaints, and is receptive to treatment. Safety maintained on unit.

## 2014-10-27 NOTE — Progress Notes (Signed)
Patient discharge at this time to boyfriend for transport home. Verbalizes he is a positive support for her, her 30 year old son is her motivation. Verbalizes understanding rt recommended discharge plan of care. Denies SI/HI/AVH at this time. Safety maintained. Belongings returned.

## 2014-10-27 NOTE — [facility] Group Notes (Signed)
[facility]  10/27/2014 6:50 PM  Participation Quality:  Appropriate  Affect:  Appropriate  Cognitive:  Appropriate  Insight:  Engaged  Engagement in Group:  Engaged  Modes of Intervention:  Discussion, Education and Support  Summary of Progress/Problems: Patients smart goal for today was to meet with SW and to get discharged  [name], Jocelyn Martin 10/27/2014, 6:50 PM

## 2014-10-27 NOTE — [facility] Suicide Risk Assessment (Signed)
[facility]   Demographic Factors:  Caucasian and Unemployed  Total Time spent with patient: 30 minutes   Psychiatric Specialty Exam: Physical Exam  ROS  Blood pressure 120/87, pulse 89, temperature 98.5 F (36.9 C), temperature source Oral, resp. rate 20, height 5\' 4"  (1.626 m), weight 102.513 kg (226 lb), last menstrual period 10/07/2014, SpO2 98 %.Body mass index is 38.77 kg/(m^2).                                                       Have you used any form of tobacco in the last 30 days? (Cigarettes, Smokeless Tobacco, Cigars, and/or Pipes): No  Has this patient used any form of tobacco in the last 30 days? (Cigarettes, Smokeless Tobacco, Cigars, and/or Pipes) No  Mental Status Per Nursing Assessment::   On Admission:  NA  Current Mental Status by Physician: denies SI, Hi or A/VH.  No evidence of delusional thinking, suspiciousness, agitation or anxiety.  Calm and pleasant.  Mood is euthymic and affect is reactive.  Guns were removed from house.  Loss Factors: NA  Historical Factors: Family history of mental illness or substance abuse  Risk Reduction Factors:   Responsible for children under 30 years of age, Sense of responsibility to family, Living with another person, especially a relative and Positive social support  Continued Clinical Symptoms:  Bipolar mania (now resolved), psychosis now resolved.     Cognitive Features That Contribute To Risk:  None    Suicide Risk:  Minimal: No identifiable suicidal ideation.  Patients presenting with no risk factors but with morbid ruminations; may be classified as minimal risk based on the severity of the depressive symptoms  Principal Problem: Bipolar affective disorder, manic, severe, with psychotic behavior Discharge Diagnoses:  Patient Active Problem List   Diagnosis Date Noted  . Benign essential HTN [I10] 10/20/2014  . Cannabis use disorder, severe, dependence  [F12.20] 10/20/2014  . Obesity [E66.9] 10/20/2014  . Bipolar affective disorder, manic, severe, with psychotic behavior [F31.2] 10/19/2014    Follow-up Information    Go to [facility].   Why:  Keep in touch with Mr. Unk PintoHarvey Bryant [facility] Peer Support at [phone].  Hospital Follow up, Outpatient Medication Management, Therapy   Contact information:   [address] [place] [address] [phone] Fax:[phone]      Plan Of Care/Follow-up recommendations:  Other:  f/u with [facility]  Is patient on multiple antipsychotic therapies at discharge:  No   Has Patient had three or more failed trials of antipsychotic monotherapy by history:  No  Recommended Plan for Multiple Antipsychotic Therapies: NA    Jimmy FootmanHernandez-Gonzalez,  Locklan Canoy 10/27/2014, 9:06 AM

## 2014-10-27 NOTE — Tx Team (Signed)
Interdisciplinary Treatment Plan Update (Adult)  Date:  10/27/2014 Time Reviewed:  6:42 PM  Progress in Treatment: Attending groups: Yes. Participating in groups:  Yes. Taking medication as prescribed:  Yes. Tolerating medication:  Yes. Family/Significant othe contact made:  NO Patient understands diagnosis:  Yes. Discussing patient identified problems/goals with staff:  Yes. Medical problems stabilized or resolved:  Yes. Denies suicidal/homicidal ideation: Yes. Issues/concerns per patient self-inventory:  No. Other:  New problem(s) identified: No, Describe:     Discharge Plan or Barriers: [facility] and return to husband  Reason for Continuation of Hospitalization: Medication stabilization  Comments:  Estimated length of stay: 1 day  New goal(s):  Review of initial/current patient goals per problem list:   Refer to plan of care  Attendees: Patient:  Jocelyn Martin June 28/16 1pm  Family:     Physician:  Dr Jennet MaduroPucilowska June 28/16 1pm  Nursing:   Marylu LundJanet RN June 28/16 1pm  Case Manager:  Wilford Gristara Mitchell LCSW June 28/16 1pm  Counselor:  Arrie Senatelaudine Edgerrin Correia LCSW June 28/16 1pm  Other:  Jake SharkSara Laws LCSW June 28/16 1pm  Other:  Princella Ionlizabeth Greene LRT June 28/16 1pm                           Scribe for Treatment Team:   Cheron SchaumannBandi, Alvah Gilder M, 10/27/2014, 6:42 PM

## 2014-10-27 NOTE — Progress Notes (Signed)
Patient with appropriate affect and cooperative behavior with meals, meds and plan of care. No SI/HI/AVH at this time. Good adls and good appetite. Social with peers, verbalizes needs well with staff. Patient aware she will discharge today when discharge plan in place and transportation available. Safety maintained.

## 2014-10-27 NOTE — Progress Notes (Signed)
  [facility] :  Will you be returning to the same living situation after discharge:  Yes,    At discharge, do you have transportation home?: Yes,    Do you have the ability to pay for your medications: Yes,     Release of information consent forms completed and in the chart;  Patient's signature needed at discharge.  Patient to Follow up at: Follow-up Information    Go to [facility].   Why:  Keep in touch with Mr. Jocelyn PintoHarvey Martin [facility] Peer Support at [phone].  Hospital Follow up, Outpatient Medication Management, Therapy   Contact information:   [address] [place] [address] [phone] Fax:[phone]      Patient denies SI/HI: Yes,       Safety Planning and Suicide Prevention discussed: Yes,     Have you used any form of tobacco in the last 30 days? (Cigarettes, Smokeless Tobacco, Cigars, and/or Pipes): No  Has patient been referred to the Quitline?: Patient refused referral  Cheron SchaumannBandi, Jocelyn Martin M 10/27/2014, 6:52 PM

## 2014-10-27 NOTE — [facility] Group Notes (Signed)
[facility] LCSW Group Therapy  10/27/2014 6:48 PM  Type of Therapy:  Group Therapy  Participation Level:  Active  Participation Quality:  Appropriate  Affect:  Appropriate  Cognitive:  Appropriate  Insight:  Engaged  Engagement in Therapy:  Engaged  Modes of Intervention:  Discussion, Problem-solving and Support  Summary of Progress/Problems:Patient was very engaged in this discussion on Anger and self regulation. She was fully engaged and also offered support and empathy to her peers  Cheron SchaumannBandi, Rhena Glace M 10/27/2014, 6:48 PM

## 2014-10-27 NOTE — [facility] Suicide Risk Assessment (Signed)
[facility] INPATIENT:  Family/Significant Other Suicide Prevention Education  Suicide Prevention Education:  Education Completed; Mamie Nickom Lowe has been identified by the patient as the family member/significant other with whom the patient will be residing, and identified as the person(s) who will aid the patient in the event of a mental health crisis (suicidal ideations/suicide attempt).  With written consent from the patient, the family member/significant other has been provided the following suicide prevention education, prior to the and/or following the discharge of the patient.  The suicide prevention education provided includes the following:  Suicide risk factors  Suicide prevention and interventions  National Suicide Hotline telephone number  [facility] assessment telephone number  [facility] Emergency Assistance 911  [facility] and/or Residential Mobile Crisis Unit telephone number  Request made of family/significant other to:  Remove weapons (e.g., guns, rifles, knives), all items previously/currently identified as safety concern.    Remove drugs/medications (over-the-counter, prescriptions, illicit drugs), all items previously/currently identified as a safety concern.  The family member/significant other verbalizes understanding of the suicide prevention education information provided.  The family member/significant other agrees to remove the items of safety concern listed above.  Arrie SenateBandi, Kaylib Furness M 10/27/2014, 6:52 PM

## 2014-10-27 NOTE — Plan of Care (Signed)
Problem: Alteration in thought process Goal: STG-Patient does not respond to command hallucinations Outcome: Progressing Denies AVH.

## 2014-10-27 NOTE — Plan of Care (Signed)
Problem: Alteration in thought process Goal: LTG-Patient verbalizes understanding importance med regimen (Patient verbalizes understanding of importance of medication regimen and need to continue outpatient care.)  Outcome: Progressing Patient compliant with medication therapy and verbalizes understanding of regimen.
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
## 2014-10-28 NOTE — Progress Notes (Signed)
[facility] H&P Discharge Summary faxed to [facility] for hospital follow-up °

## 2016-02-13 ENCOUNTER — Emergency Department
Admission: EM | Admit: 2016-02-13 | Discharge: 2016-02-14 | Disposition: A | Discharge status: Other Acute Inpatient Hospital | Payer: Medicaid Other | Attending: Emergency Medicine | Admitting: Emergency Medicine

## 2016-02-13 ENCOUNTER — Encounter: Payer: Self-pay | Admitting: Emergency Medicine

## 2016-02-13 DIAGNOSIS — F1220 Cannabis dependence, uncomplicated: Secondary | ICD-10-CM | POA: Insufficient documentation

## 2016-02-13 DIAGNOSIS — F250 Schizoaffective disorder, bipolar type: Secondary | ICD-10-CM

## 2016-02-13 DIAGNOSIS — Z87891 Personal history of nicotine dependence: Secondary | ICD-10-CM | POA: Insufficient documentation

## 2016-02-13 DIAGNOSIS — I10 Essential (primary) hypertension: Secondary | ICD-10-CM | POA: Insufficient documentation

## 2016-02-13 DIAGNOSIS — F312 Bipolar disorder, current episode manic severe with psychotic features: Secondary | ICD-10-CM | POA: Diagnosis not present

## 2016-02-13 DIAGNOSIS — R4182 Altered mental status, unspecified: Secondary | ICD-10-CM | POA: Diagnosis present

## 2016-02-13 DIAGNOSIS — Z79899 Other long term (current) drug therapy: Secondary | ICD-10-CM | POA: Insufficient documentation

## 2016-02-13 HISTORY — DX: Bipolar disorder, current episode mixed, severe, with psychotic features: F31.64

## 2016-02-13 LAB — URINE DRUG SCREEN, QUALITATIVE (ARMC ONLY)
Amphetamines, Ur Screen: NOT DETECTED
BENZODIAZEPINE, UR SCRN: NOT DETECTED
Barbiturates, Ur Screen: NOT DETECTED
Cannabinoid 50 Ng, Ur ~~LOC~~: POSITIVE — AB
Cocaine Metabolite,Ur ~~LOC~~: NOT DETECTED
MDMA (Ecstasy)Ur Screen: NOT DETECTED
Methadone Scn, Ur: NOT DETECTED
OPIATE, UR SCREEN: NOT DETECTED
PHENCYCLIDINE (PCP) UR S: NOT DETECTED
Tricyclic, Ur Screen: NOT DETECTED

## 2016-02-13 LAB — ETHANOL

## 2016-02-13 LAB — COMPREHENSIVE METABOLIC PANEL
ALT: 21 U/L (ref 14–54)
ANION GAP: 9 (ref 5–15)
AST: 26 U/L (ref 15–41)
Albumin: 4.5 g/dL (ref 3.5–5.0)
Alkaline Phosphatase: 81 U/L (ref 38–126)
BILIRUBIN TOTAL: 0.4 mg/dL (ref 0.3–1.2)
BUN: 14 mg/dL (ref 6–20)
CO2: 23 mmol/L (ref 22–32)
Calcium: 9.5 mg/dL (ref 8.9–10.3)
Chloride: 107 mmol/L (ref 101–111)
Creatinine, Ser: 0.88 mg/dL (ref 0.44–1.00)
GFR calc Af Amer: >60 mL/min (ref >60)
GLUCOSE: 132 mg/dL — AB (ref 65–99)
Potassium: 3.5 mmol/L (ref 3.5–5.1)
Sodium: 139 mmol/L (ref 135–145)
TOTAL PROTEIN: 7.8 g/dL (ref 6.5–8.1)

## 2016-02-13 LAB — URINALYSIS COMPLETE WITH MICROSCOPIC (ARMC ONLY)
Bacteria, UA: NONE SEEN
Bilirubin Urine: NEGATIVE
GLUCOSE, UA: NEGATIVE mg/dL
Ketones, ur: NEGATIVE mg/dL
Leukocytes, UA: NEGATIVE
Nitrite: NEGATIVE
Protein, ur: 30 mg/dL — AB
Specific Gravity, Urine: 1.023 (ref 1.005–1.030)
pH: 7 (ref 5.0–8.0)

## 2016-02-13 LAB — CBC
HEMATOCRIT: 41.2 % (ref 35.0–47.0)
Hemoglobin: 13.8 g/dL (ref 12.0–16.0)
MCH: 27.6 pg (ref 26.0–34.0)
MCHC: 33.5 g/dL (ref 32.0–36.0)
MCV: 82.4 fL (ref 80.0–100.0)
Platelets: 294 10*3/uL (ref 150–440)
RBC: 4.99 MIL/uL (ref 3.80–5.20)
RDW: 15 % — AB (ref 11.5–14.5)
WBC: 13.8 10*3/uL — ABNORMAL HIGH (ref 3.6–11.0)

## 2016-02-13 LAB — ACETAMINOPHEN LEVEL

## 2016-02-13 LAB — SALICYLATE LEVEL: Salicylate Lvl: <7 mg/dL (ref 2.8–30.0)

## 2016-02-13 MED ORDER — LITHIUM CARBONATE ER 450 MG PO TBCR
450.0000 mg | EXTENDED_RELEASE_TABLET | Freq: Two times a day (BID) | ORAL | Status: DC
  Administered 2016-02-13 – 2016-02-14 (×3): 450 mg via ORAL
  Filled 2016-02-13 (×3): qty 1

## 2016-02-13 MED ORDER — ARIPIPRAZOLE 5 MG PO TABS
15.0000 mg | ORAL_TABLET | Freq: Every day | ORAL | Status: DC
  Administered 2016-02-13 – 2016-02-14 (×2): 15 mg via ORAL
  Filled 2016-02-13: qty 1
  Filled 2016-02-13: qty 3

## 2016-02-13 MED ORDER — ZIPRASIDONE MESYLATE 20 MG IM SOLR
10.0000 mg | Freq: Once | INTRAMUSCULAR | Status: AC
  Administered 2016-02-13: 10 mg via INTRAMUSCULAR

## 2016-02-13 MED ORDER — AMLODIPINE BESYLATE 5 MG PO TABS
5.0000 mg | ORAL_TABLET | Freq: Every day | ORAL | Status: DC
  Administered 2016-02-13 – 2016-02-14 (×2): 5 mg via ORAL
  Filled 2016-02-13 (×2): qty 1
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
## 2016-02-13 NOTE — ED Notes (Signed)
Pt ask for a drink of water. Ice water given to pt.

## 2016-02-13 NOTE — ED Triage Notes (Addendum)
Pt IVC'd via [place] [employer] with manic and hyper-religious behavior, believes herself to be the "[place] [employer] Mary", talks about herself the the third person using female pronouns, the female is "God, he's talking through me", pt is paranoid and especially suspicious of females, laughing inappropriately.  Reports living with [name] (BF of 12 years), and son, Nate 70(31 y/o), Elijah Birkom called the police because "I would lay down with him because he's got the demon in him".    Pt with [facility], hx of  Bipolar/schizophrenia, anxiety, PTSD, stopped taking lithium in June.

## 2016-02-13 NOTE — ED Notes (Signed)
Patient refused snack tray 
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
## 2016-02-13 NOTE — ED Notes (Signed)
Pt resting in bed, resp even and unlabored, eyes closed 

## 2016-02-13 NOTE — Progress Notes (Signed)
Attempted to assess pt for TTS per pt. RN Zollie Scalelivia, pt. Was aggressive last night and has been , and is currently sedated and unable to participate. Devin Foskey K. Sherlon HandingHarris, LCAS-A, LPC-A, [facility]  Counselor 02/13/2016 10:05 AM

## 2016-02-13 NOTE — ED Notes (Signed)
UPREG negative ?

## 2016-02-13 NOTE — ED Notes (Signed)
Patient is bizarre and appears angry at presence of female staff. Security escort with medication administration.  Spoke to female staff. Compliant with medications.  Support offered.

## 2016-02-13 NOTE — ED Provider Notes (Signed)
[facility] Emergency Department Provider Note   ____________________________________________   First MD Initiated Contact with Patient 02/13/16 0302     (approximate)  I have reviewed the triage vital signs and the nursing notes.   HISTORY  Chief Complaint Altered Mental Status  Limited by psychosis  HPI Jocelyn Martin is a 30 y.o. female brought to the ED under IVC by police for psychosis. Patient has a history of bipolar disorder, schizophrenia, PTSD who reportedly stopped taking her lithium and June. Presents with hyper religious and paranoid behavior. Rest of history unable to be assessed secondary to patient's psychosis.   Past Medical History:  Diagnosis Date  . Bipolar disord, crnt episode mixed, severe, w psych features (HCC)   . PTSD (post-traumatic stress disorder)     Patient Active Problem List   Diagnosis Date Noted  . Benign essential HTN 10/20/2014  . Cannabis use disorder, severe, dependence (HCC) 10/20/2014  . Obesity 10/20/2014  . Bipolar affective disorder, manic, severe, with psychotic behavior (HCC) 10/19/2014    Past Surgical History:  Procedure Laterality Date  . ankle Left   . INNER EAR SURGERY      Prior to Admission medications   Medication Sig Start Date End Date Taking? Authorizing Provider  amLODipine (NORVASC) 10 MG tablet Take 1 tablet (10 mg total) by mouth daily. 10/26/14   Jimmy Footman, MD  ARIPiprazole (ABILIFY) 15 MG tablet Take 1 tablet (15 mg total) by mouth at bedtime. 10/26/14   Jimmy Footman, MD  lithium carbonate (ESKALITH) 450 MG CR tablet Take 1 tablet (450 mg total) by mouth every 12 (twelve) hours. 10/26/14   Jimmy Footman, MD  loratadine (CLARITIN) 10 MG tablet Take 1 tablet (10 mg total) by mouth daily. 10/26/14   Jimmy Footman, MD    Allergies Review of patient's allergies indicates no known allergies.  History reviewed. No pertinent family  history.  Social History Social History  Substance Use Topics  . Smoking status: Former Games developer  . Smokeless tobacco: Never Used  . Alcohol use Yes    Review of Systems  Constitutional: No fever/chills. Eyes: No visual changes. ENT: No sore throat. Cardiovascular: Denies chest pain. Respiratory: Denies shortness of breath. Gastrointestinal: No abdominal pain.  No nausea, no vomiting.  No diarrhea.  No constipation. Genitourinary: Negative for dysuria. Musculoskeletal: Negative for back pain. Skin: Negative for rash. Neurological: Negative for headaches, focal weakness or numbness. Psychiatric:Positive for psychosis.  10-point ROS otherwise negative.  ____________________________________________   PHYSICAL EXAM:  VITAL SIGNS: ED Triage Vitals  Enc Vitals Group     BP 02/13/16 0226 (!) 160/89     Pulse Rate 02/13/16 0226 (!) 119     Resp 02/13/16 0226 (!) 22     Temp 02/13/16 0226 98.7 F (37.1 C)     Temp Source 02/13/16 0226 Oral     SpO2 02/13/16 0226 93 %     Weight 02/13/16 0227 273 lb (123.8 kg)     Height 02/13/16 0227 5\' 4"  (1.626 m)     Head Circumference --      Peak Flow --      Pain Score --      Pain Loc --      Pain Edu? --      Excl. in GC? --     Constitutional: Alert and oriented. Disheveled appearing and in mild acute distress. Eyes: Conjunctivae are normal. PERRL. EOMI. Head: Atraumatic. Nose: No congestion/rhinnorhea. Mouth/Throat: Mucous membranes are moist.  Oropharynx  non-erythematous. Neck: No stridor.   Cardiovascular: Normal rate, regular rhythm. Grossly normal heart sounds.  Good peripheral circulation. Respiratory: Normal respiratory effort.  No retractions. Lungs CTAB. Gastrointestinal: Soft and nontender. No distention. No abdominal bruits. No CVA tenderness. Musculoskeletal: No lower extremity tenderness nor edema.  No joint effusions. Neurologic:  Normal speech and language. No gross focal neurologic deficits are appreciated.  No gait instability. Skin:  Skin is warm, dry and intact. No rash noted. Psychiatric: Mood and affect are paranoid. Inappropriate laughing. Intermittent loud outbursts. Speech and behavior are erratic.  ____________________________________________   LABS (all labs ordered are listed, but only abnormal results are displayed)  Labs Reviewed  COMPREHENSIVE METABOLIC PANEL - Abnormal; Notable for the following:       Result Value   Glucose, Bld 132 (*)    All other components within normal limits  ACETAMINOPHEN LEVEL - Abnormal; Notable for the following:    Acetaminophen (Tylenol), Serum <10 (*)    All other components within normal limits  CBC - Abnormal; Notable for the following:    WBC 13.8 (*)    RDW 15.0 (*)    All other components within normal limits  URINE DRUG SCREEN, QUALITATIVE ([facility] ONLY) - Abnormal; Notable for the following:    Cannabinoid 50 Ng, Ur [place] POSITIVE (*)    All other components within normal limits  URINALYSIS COMPLETEWITH MICROSCOPIC ([facility] ONLY) - Abnormal; Notable for the following:    Color, Urine YELLOW (*)    APPearance HAZY (*)    Hgb urine dipstick 3+ (*)    Protein, ur 30 (*)    Squamous Epithelial / LPF 0-5 (*)    All other components within normal limits  ETHANOL  SALICYLATE LEVEL  POC URINE PREG, ED   ____________________________________________  EKG  None ____________________________________________  RADIOLOGY  None ____________________________________________   PROCEDURES  Procedure(s) performed: None  Procedures  Critical Care performed: No  ____________________________________________   INITIAL IMPRESSION / ASSESSMENT AND PLAN / ED COURSE  Pertinent labs & imaging results that were available during my care of the patient were reviewed by me and considered in my medical decision making (see chart for details).  30 year old female with a history of bipolar disorder, schizophrenia brought in under IVC for psychosis.  Patient is hyperreligious and "blessing us" with water from the bathroom sink. She is having loud and aggressive outbursts requiring IM calming agents.  Clinical Course  Comment By Time  Patient now is more calm. Allows me to examine her. Resting in bed in no acute distress. Still appears to be paranoid. Irean HongJade J Sung, MD 10/16 [phone]  Sleeping in no acute distress. Patient will remain in the emergency department under IVC pending psychiatry evaluation this morning. Irean HongJade J Sung, MD 10/16 [phone]     ____________________________________________   FINAL CLINICAL IMPRESSION(S) / ED DIAGNOSES  Final diagnoses:  Schizoaffective disorder, bipolar type (HCC)      NEW MEDICATIONS STARTED DURING THIS VISIT:  New Prescriptions   No medications on file     Note:  This document was prepared using Dragon voice recognition software and may include unintentional dictation errors.    Irean HongJade J Sung, MD 02/13/16 [phone]

## 2016-02-13 NOTE — ED Notes (Signed)
Pt resting in bed, pt refused breakfast
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
## 2016-02-13 NOTE — ED Notes (Signed)
Report to olivia, rn.  
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
## 2016-02-13 NOTE — ED Notes (Signed)
Patient is resting quietly with eyes closed.
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
## 2016-02-13 NOTE — ED Notes (Signed)
Pt resting with eyes closed, but awakes with passage of rn past room. Pt declines offer for po fluids or blanket.

## 2016-02-14 ENCOUNTER — Inpatient Hospital Stay
Admission: RE | Admit: 2016-02-14 | Discharge: 2016-02-19 | DRG: 885 | Disposition: A | Discharge status: Home / Self Care | Payer: Medicaid Other | Source: Ambulatory Visit | Attending: Psychiatry | Admitting: Psychiatry

## 2016-02-14 DIAGNOSIS — I10 Essential (primary) hypertension: Secondary | ICD-10-CM | POA: Diagnosis present

## 2016-02-14 DIAGNOSIS — Z87891 Personal history of nicotine dependence: Secondary | ICD-10-CM | POA: Diagnosis not present

## 2016-02-14 DIAGNOSIS — Z79899 Other long term (current) drug therapy: Secondary | ICD-10-CM | POA: Diagnosis not present

## 2016-02-14 DIAGNOSIS — Z9114 Patient's other noncompliance with medication regimen: Secondary | ICD-10-CM

## 2016-02-14 DIAGNOSIS — N915 Oligomenorrhea, unspecified: Secondary | ICD-10-CM | POA: Diagnosis present

## 2016-02-14 DIAGNOSIS — F1220 Cannabis dependence, uncomplicated: Secondary | ICD-10-CM | POA: Diagnosis present

## 2016-02-14 DIAGNOSIS — F312 Bipolar disorder, current episode manic severe with psychotic features: Principal | ICD-10-CM | POA: Diagnosis present

## 2016-02-14 DIAGNOSIS — F4310 Post-traumatic stress disorder, unspecified: Secondary | ICD-10-CM | POA: Diagnosis present

## 2016-02-14 DIAGNOSIS — Z6841 Body Mass Index (BMI) 40.0 and over, adult: Secondary | ICD-10-CM | POA: Diagnosis not present

## 2016-02-14 DIAGNOSIS — G4700 Insomnia, unspecified: Secondary | ICD-10-CM | POA: Diagnosis present

## 2016-02-14 LAB — BLOOD GAS, VENOUS
PATIENT TEMPERATURE: 37
PCO2 VEN: 39 mmHg — AB (ref 44.0–60.0)
PO2 VEN: 50 mmHg — AB (ref 32.0–45.0)
pH, Ven: 7.4 (ref 7.250–7.430)

## 2016-02-14 LAB — TSH: TSH: 3.013 u[IU]/mL (ref 0.350–4.500)

## 2016-02-14 LAB — LACTIC ACID, PLASMA: Lactic Acid, Venous: 0.9 mmol/L (ref 0.5–1.9)

## 2016-02-14 MED ORDER — ARIPIPRAZOLE 5 MG PO TABS
15.0000 mg | ORAL_TABLET | Freq: Every day | ORAL | Status: DC
  Administered 2016-02-15: 15 mg via ORAL
  Filled 2016-02-14: qty 1

## 2016-02-14 MED ORDER — HYDROXYZINE HCL 25 MG PO TABS
25.0000 mg | ORAL_TABLET | Freq: Three times a day (TID) | ORAL | Status: DC | PRN
  Administered 2016-02-14: 25 mg via ORAL
  Filled 2016-02-14: qty 1

## 2016-02-14 MED ORDER — ALUM & MAG HYDROXIDE-SIMETH 200-200-20 MG/5ML PO SUSP: 30.0000 mL | ORAL | Status: DC | PRN

## 2016-02-14 MED ORDER — LITHIUM CARBONATE ER 450 MG PO TBCR
450.0000 mg | EXTENDED_RELEASE_TABLET | Freq: Two times a day (BID) | ORAL | Status: DC
  Administered 2016-02-14 – 2016-02-19 (×10): 450 mg via ORAL
  Filled 2016-02-14 (×10): qty 1

## 2016-02-14 MED ORDER — MAGNESIUM HYDROXIDE 400 MG/5ML PO SUSP: 30.0000 mL | Freq: Every day | ORAL | Status: DC | PRN

## 2016-02-14 MED ORDER — SODIUM CHLORIDE 0.9 % IV BOLUS (SEPSIS)
1000.0000 mL | Freq: Once | INTRAVENOUS | Status: AC
  Administered 2016-02-14: 1000 mL via INTRAVENOUS

## 2016-02-14 MED ORDER — CLONIDINE HCL 0.1 MG PO TABS: 0.1000 mg | ORAL_TABLET | Freq: Once | ORAL | Status: DC | PRN

## 2016-02-14 MED ORDER — ACETAMINOPHEN 325 MG PO TABS
650.0000 mg | ORAL_TABLET | Freq: Four times a day (QID) | ORAL | Status: DC | PRN
  Administered 2016-02-14 – 2016-02-18 (×4): 650 mg via ORAL
  Filled 2016-02-14 (×4): qty 2

## 2016-02-14 MED ORDER — AMLODIPINE BESYLATE 5 MG PO TABS
5.0000 mg | ORAL_TABLET | Freq: Every day | ORAL | Status: DC
  Administered 2016-02-15 – 2016-02-19 (×5): 5 mg via ORAL
  Filled 2016-02-14 (×5): qty 1

## 2016-02-14 NOTE — ED Provider Notes (Signed)
Patient had some tachycardia, likely from anxiety. Patient states she was feeling nervous and anxious. We are checking labs to assess for hyperthyroidism. She remains medically stable for psychiatric evaluation. Vitals:   02/14/16 0556 02/14/16 0712  BP: (!) 157/93 (!) 156/106  Pulse: (!) 119 (!) 109  Resp: 18 18  Temp: 98.8 F (37.1 C)       Jocelyn FilbertJonathan E Jeffory Snelgrove, MD 02/14/16 [phone]

## 2016-02-14 NOTE — ED Notes (Signed)
Patient noted in room. No complaints, stable, in no acute distress. Q15 minute rounds and monitoring via Security Cameras to continue.  

## 2016-02-14 NOTE — Progress Notes (Signed)
[facility] by Dr. Toni Amendlapacs.  Attending Physician will be Dr. Jennet MaduroPucilowska.   Patient [facility]wen.   Intake Paper Work has been signed and placed on patient chart.  ER staff is aware of the admission Misty Stanley( Lisa, ER Sect.; Dr.Williams , ER MD; Amy Patient's Nurse &  Patient Access). Mcdaniel Ohms K. Sherlon HandingHarris, LCAS-A, LPC-A, [facility]  Counselor 02/14/2016 2:35 PM

## 2016-02-14 NOTE — ED Notes (Signed)
BEHAVIORAL HEALTH ROUNDING Patient sleeping: No. Patient alert and oriented: yes Behavior appropriate: Yes.  ; If no, describe:  Nutrition and fluids offered: yes Toileting and hygiene offered: Yes  Sitter present: q15 minute observations and security  monitoring Law enforcement present: Yes  ODS  

## 2016-02-14 NOTE — ED Notes (Signed)
ED BHU PLACEMENT JUSTIFICATION Is the patient under IVC or is there intent for IVC: Yes.   Is the patient medically cleared: Yes.   Is there vacancy in the ED BHU: Yes.   Is the population mix appropriate for patient: Yes.   Is the patient awaiting placement in inpatient or outpatient setting: Yes.   inpt admission per Clapacs note Has the patient had a psychiatric consult: Yes.   Survey of unit performed for contraband, proper placement and condition of furniture, tampering with fixtures in bathroom, shower, and each patient room: Yes.  ; Findings:  APPEARANCE/BEHAVIOR Calm and cooperative NEURO ASSESSMENT Orientation: oriented x 4  Denies pain Hallucinations: No.None noted (Hallucinations) Speech: Normal Gait: normal RESPIRATORY ASSESSMENT Even  Unlabored respirations  CARDIOVASCULAR ASSESSMENT Pulses equal   regular rate  Skin warm and dry   GASTROINTESTINAL ASSESSMENT no GI complaint EXTREMITIES Full ROM  PLAN OF CARE Provide calm/safe environment. Vital signs assessed twice daily. ED BHU Assessment once each 12-hour shift. Collaborate with TTS daily or as condition indicates. Assure the ED provider has rounded once each shift. Provide and encourage hygiene. Provide redirection as needed. Assess for escalating behavior; address immediately and inform ED provider.  Assess family dynamic and appropriateness for visitation as needed: Yes.  ; If necessary, describe findings:  Educate the patient/family about BHU procedures/visitation: Yes.  ; If necessary, describe findings:

## 2016-02-14 NOTE — ED Notes (Signed)

## 2016-02-14 NOTE — Tx Team (Signed)
Initial Treatment Plan 02/14/2016 8:51 PM Jocelyn Martin ZOX:[id number]    PATIENT STRESSORS: Financial difficulties Medication change or noncompliance   PATIENT STRENGTHS: General fund of knowledge Supportive family/friends   PATIENT IDENTIFIED PROBLEMS: "My goal is to make things better and shoot for the stars."  Psychosis   HTN                  DISCHARGE CRITERIA:  Improved stabilization in mood, thinking, and/or behavior  PRELIMINARY DISCHARGE PLAN: Outpatient therapy  PATIENT/FAMILY INVOLVEMENT: This treatment plan has been presented to and reviewed with the patient, Jocelyn Martin, and/or family member.  The patient and family have been given the opportunity to ask questions and make suggestions.  [name], RN 02/14/2016, 8:51 PM

## 2016-02-14 NOTE — ED Notes (Signed)

## 2016-02-14 NOTE — Progress Notes (Signed)
Patient ID: Jocelyn Martin, female   DOB: 14-Mar-1986, 30 y.o.   MRN: [id number]  Patient admitted after having some bizarre behaviors. Denies SI/HI/AVH. States she doesn't really know why she's here and just remember "bits and pieces" about what happened. Exhibiting hyperverbal and euphoric behaviors. States her goal for being is, "to make things better and shoot for the stars." States she stopped taking her medications because it was making her tired and her doctor wasn't willing to change it. Elevated BP and pulse. MD notified. PRN clonidine ordered. HS medications given with PRN vistaril and PRN Tylenol. Patient oriented to the unit. Nourishment provided. Safety maintained with 15 min checks.

## 2016-02-14 NOTE — ED Notes (Addendum)
Contacted EDP concerning patients heart rate.  Patient will be transported to main ED for fluid.

## 2016-02-14 NOTE — Plan of Care (Signed)
Problem: Safety: Goal: Periods of time without injury will increase Outcome: Progressing Patient has been without injury since admission.    

## 2016-02-14 NOTE — ED Notes (Signed)
Pt  ivc pending  Placement . Pt  [facility]  [facility]  MED  TONIGHT  PER  SHAWN TTS

## 2016-02-14 NOTE — ED Notes (Signed)
[facility] to [facility].  Okay to take patient down at this time.

## 2016-02-14 NOTE — ED Notes (Signed)
MD consult note reads that pt will be admitted to inpt psych  I have attempted to call TTS for an update but received no answer  I will continue to monitor pt closely and await inpt bed assignment  Per EDP Williams note pt is medically cleared

## 2016-02-15 ENCOUNTER — Encounter: Payer: Self-pay | Admitting: Psychiatry

## 2016-02-15 DIAGNOSIS — F312 Bipolar disorder, current episode manic severe with psychotic features: Principal | ICD-10-CM

## 2016-02-15 LAB — LIPID PANEL
CHOLESTEROL: 186 mg/dL (ref 0–200)
HDL: 33 mg/dL — AB (ref >40)
LDL Cholesterol: 125 mg/dL — ABNORMAL HIGH (ref 0–99)
TRIGLYCERIDES: 142 mg/dL (ref <150)
Total CHOL/HDL Ratio: 5.6 RATIO
VLDL: 28 mg/dL (ref 0–40)

## 2016-02-15 LAB — TSH: TSH: 3.193 u[IU]/mL (ref 0.350–4.500)

## 2016-02-15 MED ORDER — TEMAZEPAM 15 MG PO CAPS: 15.0000 mg | ORAL_CAPSULE | Freq: Every evening | ORAL | Status: DC | PRN

## 2016-02-15 MED ORDER — ZIPRASIDONE HCL 20 MG PO CAPS
40.0000 mg | ORAL_CAPSULE | Freq: Two times a day (BID) | ORAL | Status: DC
  Administered 2016-02-15 – 2016-02-16 (×3): 40 mg via ORAL
  Filled 2016-02-15 (×3): qty 2

## 2016-02-15 NOTE — Progress Notes (Signed)
D:  Patient has been interacting appropriately with staff and peers.  Patient has been attending group sessions and has been interacting appropriately. A:  Patient administered scheduled medications.  Patient encouraged to attend group sessions. R:  Patient safety maintained with 15 minute checks.  Patient provided with a safe environment.
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
## 2016-02-15 NOTE — Progress Notes (Signed)
Recreation Therapy Notes  Date: 10.18.17 Time: 9:30 am Location: Craft Room  Group Topic: Self-esteem  Goal Area(s) Addresses:  Patient will identify positive traits about self. Patient will identify at least one healthy coping skill.  Behavioral Response: Attentive, Interactive  Intervention: All About Me  Activity: Patients were instructed to make an All About Me pamphlet including their life motto, positive traits about themselves, healthy coping skills, and their healthy support system.  Education: LRT educated patients on ways to increase their self-esteem.  Education Outcome: Acknowledges education/In group clarification offered  Clinical Observations/Feedback: Patient completed activity by writing positive traits, healthy coping skills, and her support system. Patient contributed to group discussion by stating that it was easy for her to think of positive traits and why, how her self-esteem affects her, and how she can increase her self-esteem.  [name],Delsin Copen M, LRT/CTRS 02/15/2016 10:20 AM

## 2016-02-15 NOTE — [facility] Group Notes (Signed)
[facility] LCSW Group Therapy   02/15/2016  9:30 am   Type of Therapy: Group Therapy   Participation Level: Active   Participation Quality: Attentive, Sharing and Supportive   Affect: Appropriate  Cognitive: Alert and Oriented   Insight: Developing/Improving and Engaged   Engagement in Therapy: Developing/Improving and Engaged   Modes of Intervention: Clarification, Confrontation, Discussion, Education, Exploration, Limit-setting, Orientation, Problem-solving, Rapport Building, Dance movement psychotherapisteality Testing, Socialization and Support   Summary of Progress/Problems: The topic for group today was emotional regulation. This group focused on both positive and negative emotion identification and allowed  group members to process ways to identify feelings, regulate negative emotions, and find healthy ways to manage internal/external emotions. Group members were asked to reflect on a time when their reaction to an emotion led to a negative outcome and explored how alternative responses using emotion regulation would have benefited them. Group members were also asked to discuss a time when emotion regulation was utilized when a negative emotion was experienced. Pt shared the pt regulates the pt's emotions by "listening to music, dancing and creating art.  Pt shared the pt shared that on one occasion the pt was emotionally distraught and danced until the pt became calm. Pt was polite and cooperative with the CSW and other group members and focused and attentive to the topics discussed and the sharing of others.    Dorothe PeaJonathan F. Lionel Woodberry, MSW, LCSWA, LCAS
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
## 2016-02-15 NOTE — BHH Counselor (Deleted)
Adult Comprehensive Assessment  Patient ID: Jocelyn Martin, female   DOB: 03/07/1986, 30 y.o.   MRN: [id number]  Information Source: Information source: Patient  Current Stressors:     Living/Environment/Situation:  Living Arrangements: (S) Spouse/significant other, Children (son who is 10 )  Family History:     Childhood History:     Education:     Employment/Work Situation:      Architectinancial Resources:      Alcohol/Substance Abuse:      Social Support System:      Leisure/Recreation:      Strengths/Needs:      Discharge Plan:      Summary/Recommendations:   Emergency planning/management officerummary and Recommendations (to be completed by the evaluator): Jocelyn Martin is a 30 year old female who [facility]unt[facility] to [facility] after a conflict with her boyfriend. Prior to admission, pt reported she experienced auditory hallucinations. Pt reports not sleeping and increased energy. Pt currently lives in [place] with her 31 year old son and his father. Pt reports she and her husband rarely fight excepting the conflict with her boyfriend that was the primary trigger for admission. Pt's primary stressors are an unclean home, her mother's physical illness and problems associated with other family members. Pt is unemployed but receives SSDI and Medicaid. Pt does not currently receive outpatient services but would like a referral to [facility]. Pt plans to return home and follow up with outpatient services. Recommendations include; crisis stabilization, medication management, therapeutic milieu, and encourage group attendance and participation.   Dorothe PeaJonathan F Nakyla Bracco. 02/15/2016

## 2016-02-15 NOTE — Progress Notes (Signed)
Patient visible in the milieu. Currently in the dayroom with staff and peers. Alert and oriented. Denied suicidal/homicidal thoughts. Interacting with staff and peers appropriately. Safety precautions maintained.

## 2016-02-16 LAB — HEMOGLOBIN A1C
Hgb A1c MFr Bld: 5.5 % (ref 4.8–5.6)
MEAN PLASMA GLUCOSE: 111 mg/dL

## 2016-02-16 LAB — PROLACTIN: Prolactin: 21.7 ng/mL (ref 4.8–23.3)

## 2016-02-16 MED ORDER — ZIPRASIDONE HCL 20 MG PO CAPS
40.0000 mg | ORAL_CAPSULE | Freq: Every day | ORAL | Status: DC
  Administered 2016-02-16: 40 mg via ORAL
  Filled 2016-02-16: qty 2

## 2016-02-16 MED ORDER — ZIPRASIDONE HCL 20 MG PO CAPS: 80.0000 mg | ORAL_CAPSULE | Freq: Two times a day (BID) | ORAL | Status: DC

## 2016-02-16 NOTE — B[facility] Group Notes (Signed)
[facility] Group Notes:  (Nursing/MHT/Case Management/Adjunct)  Date:  02/16/2016  Time:  4:29 PM  Type of Therapy:  Psychoeducational Skills  Participation Level:  Active  Participation Quality:  Appropriate  Affect:  Appropriate  Cognitive:  Appropriate  Insight:  Appropriate  Engagement in Group:  Engaged  Modes of Intervention:  Discussion and Education  Summary of Progress/Problems:  Jocelyn Martin 02/16/2016, 4:29 PM

## 2016-02-16 NOTE — Progress Notes (Signed)
Patient remained pleasant on the unit. Stayed in the dayroom then went to sleep. Has been sleeping and had no concern Safety precautions maintained.

## 2016-02-16 NOTE — [facility] Group Notes (Signed)
[facility] Group Notes:  (Nursing/MHT/Case Management/Adjunct)  Date:  02/16/2016  Time:  10:09 PM  Type of Therapy:  Psychoeducational Skills   Participation Level:  Active  Participation Quality:  Appropriate  Affect:  Appropriate  Cognitive:  Alert  Insight:  Good  Engagement in Group:  Engaged  Modes of Intervention:  Support  Summary of Progress/Problems:  Jocelyn NeerJackie L Ladale Sherburn 02/16/2016, 10:09 PM

## 2016-02-16 NOTE — Plan of Care (Signed)
Problem: Pain Managment: Goal: General experience of comfort will improve Outcome: Progressing No sign of pain or discomfort. Pleasant and cooperative

## 2016-02-16 NOTE — Progress Notes (Addendum)
[facility] MD Progress Note  02/16/2016 1:30 PM Carmine Youngberg  MRN:  [id number]  Subjective:  Ms. Grealish met with treatment team today. She is happily manic with some grandiose delusions and possibly hallucinations. She was started on Geodon yesterday and seems to tolerate it well. She reports good sleep last night. Good program participation.  Principal Problem: Bipolar affective disorder, current episode manic with psychotic symptoms ([place]) Diagnosis:   Patient Active Problem List   Diagnosis Date Noted  . Bipolar affective disorder, current episode manic with psychotic symptoms ([place]) [F31.2] 02/14/2016  . Benign essential HTN [I10] 10/20/2014  . Cannabis use disorder, severe, dependence ([place]) [F12.20] 10/20/2014   Total Time spent with patient: 20 minutes  Past Psychiatric History: Bipolar disorder.  Past Medical History:  Past Medical History:  Diagnosis Date  . Bipolar disord, crnt episode mixed, severe, w psych features ([place])   . PTSD (post-traumatic stress disorder)     Past Surgical History:  Procedure Laterality Date  . ankle Left   . INNER EAR SURGERY     Family History: History reviewed. No pertinent family history. Family Psychiatric  History: See H&P. Social History:  History  Alcohol Use  . Yes    Comment: rare     History  Drug Use  . Types: Marijuana    Comment: 3-4 bowels a day    Social History   Social History  . Marital status: Single    Spouse name: N/A  . Number of children: N/A  . Years of education: N/A   Social History Main Topics  . Smoking status: Former Research scientist (life sciences)  . Smokeless tobacco: Never Used  . Alcohol use Yes     Comment: rare  . Drug use:     Types: Marijuana     Comment: 3-4 bowels a day  . Sexual activity: Not Asked   Other Topics Concern  . None   Social History Narrative  . None   Additional Social History:                         Sleep: Fair  Appetite:  Fair  Current Medications: Current Facility-Administered  Medications  Medication Dose Route Frequency Provider Last Rate Last Dose  . acetaminophen (TYLENOL) tablet 650 mg  650 mg Oral Q6H PRN Gonzella Lex, MD   650 mg at 02/15/16 0810  . alum & mag hydroxide-simeth (MAALOX/MYLANTA) 200-200-20 MG/5ML suspension 30 mL  30 mL Oral Q4H PRN Gonzella Lex, MD      . amLODipine (NORVASC) tablet 5 mg  5 mg Oral Daily Gonzella Lex, MD   5 mg at 02/16/16 0839  . cloNIDine (CATAPRES) tablet 0.1 mg  0.1 mg Oral Once PRN Hildred Priest, MD      . hydrOXYzine (ATARAX/VISTARIL) tablet 25 mg  25 mg Oral TID PRN Gonzella Lex, MD   25 mg at 02/14/16 2137  . lithium carbonate (ESKALITH) CR tablet 450 mg  450 mg Oral Q12H Gonzella Lex, MD   450 mg at 02/16/16 0839  . magnesium hydroxide (MILK OF MAGNESIA) suspension 30 mL  30 mL Oral Daily PRN Gonzella Lex, MD      . temazepam (RESTORIL) capsule 15 mg  15 mg Oral QHS PRN Hanad Leino B Shelby Peltz, MD      . ziprasidone (GEODON) capsule 40 mg  40 mg Oral BID WC Clovis Fredrickson, MD   40 mg at 02/16/16 0839    Lab  Results:  Results for orders placed or performed during the hospital encounter of 02/14/16 (from the past 48 hour(s))  Hemoglobin A1c     Status: None   Collection Time: 02/15/16  7:17 AM  Result Value Ref Range   Hgb A1c MFr Bld 5.5 4.8 - 5.6 %    Comment: (NOTE)         Pre-diabetes: 5.7 - 6.4         Diabetes: >6.4         Glycemic control for adults with diabetes: <7.0    Mean Plasma Glucose 111 mg/dL    Comment: (NOTE) Performed At: [facility] [place], [place] [id number] Lindon Romp MD [id number]   Lipid panel     Status: Abnormal   Collection Time: 02/15/16  7:17 AM  Result Value Ref Range   Cholesterol 186 0 - 200 mg/dL   Triglycerides 142 <150 mg/dL   HDL 33 (L) >40 mg/dL   Total CHOL/HDL Ratio 5.6 RATIO   VLDL 28 0 - 40 mg/dL   LDL Cholesterol 125 (H) 0 - 99 mg/dL    Comment:        Total Cholesterol/HDL:CHD Risk Coronary Heart  Disease Risk Table                     Men   Women  1/2 Average Risk   3.4   3.3  Average Risk       5.0   4.4  2 X Average Risk   9.6   7.1  3 X Average Risk  23.4   11.0        Use the calculated Patient Ratio above and the CHD Risk Table to determine the patient's CHD Risk.        ATP III CLASSIFICATION (LDL):  <100     mg/dL   Optimal  100-129  mg/dL   Near or Above                    Optimal  130-159  mg/dL   Borderline  160-189  mg/dL   High  >190     mg/dL   Very High   Prolactin     Status: None   Collection Time: 02/15/16  7:17 AM  Result Value Ref Range   Prolactin 21.7 4.8 - 23.3 ng/mL    Comment: (NOTE) Performed At: [facility] [place], [place] [id number] Lindon Romp MD [id number]   TSH     Status: None   Collection Time: 02/15/16  7:17 AM  Result Value Ref Range   TSH 3.193 0.350 - 4.500 uIU/mL    Comment: Performed by a 3rd Generation assay with a functional sensitivity of <=0.01 uIU/mL.    Blood Alcohol level:  Lab Results  Component Value Date   [facility] <5 02/13/2016   ETH <5 26/33/3545    Metabolic Disorder Labs: Lab Results  Component Value Date   HGBA1C 5.5 02/15/2016   MPG 111 02/15/2016   Lab Results  Component Value Date   PROLACTIN 21.7 02/15/2016   Lab Results  Component Value Date   CHOL 186 02/15/2016   TRIG 142 02/15/2016   HDL 33 (L) 02/15/2016   CHOLHDL 5.6 02/15/2016   VLDL 28 02/15/2016   LDLCALC 125 (H) 02/15/2016   LDLCALC 116 (H) 10/19/2014    Physical Findings: AIMS:  , ,  ,  ,    CIWA:  COWS:     Musculoskeletal: Strength & Muscle Tone: within normal limits Gait & Station: normal Patient leans: N/A  Psychiatric Specialty Exam: Physical Exam  Nursing note and vitals reviewed.   Review of Systems  Psychiatric/Behavioral: Positive for hallucinations.  All other systems reviewed and are negative.   Blood pressure (!) 154/101, pulse 98, temperature 98.4 F (36.9 C),  temperature source Oral, resp. rate 16, height '5\' 4"'  (1.626 m), weight 126.1 kg (278 lb), last menstrual period 02/13/2016, SpO2 97 %.Body mass index is 47.72 kg/m.  General Appearance: Casual  Eye Contact:  Good  Speech:  Pressured  Volume:  Increased  Mood:  Euphoric  Affect:  Congruent  Thought Process:  Goal Directed and Descriptions of Associations: Intact  Orientation:  Full (Time, Place, and Person)  Thought Content:  Delusions, Hallucinations: Auditory and Paranoid Ideation  Suicidal Thoughts:  No  Homicidal Thoughts:  No  Memory:  Immediate;   Fair Recent;   Fair Remote;   Fair  Judgement:  Impaired  Insight:  Lacking  Psychomotor Activity:  Increased  Concentration:  Concentration: Fair and Attention Span: Fair  Recall:  [employer] of Knowledge:  Fair  Language:  Fair  Akathisia:  No  Handed:  Right  AIMS (if indicated):     Assets:  Communication Skills Desire for Improvement Financial Resources/Insurance Housing Intimacy Physical Health Resilience Social Support  ADL's:  Intact  Cognition:  WNL  Sleep:  Number of Hours: 6     Treatment Plan Summary: Daily contact with patient to assess and evaluate symptoms and progress in treatment and Medication management   Ms. Freimark is a 30 year old female with a history of bipolar illness admitted in a manic, psychotic episode in the context of treatment noncompliance.  1. Agitation. This has resolved.  2. Mood and psychosis. She was restarted on lithium for mood stabilization and Abilify for psychosis. She refuses to take Abilify as she thinks it may have extremely sleepy. She agreed to try Geodon. Will increase Geodon to 80 mg bid.  3. Insomnia. She is on Restoril.  4. Anxiety. Hydroxyzine is available.  5. Hypertension. She is on clonidine and Norvasc.  6. Metabolic syndrome monitoring. Lipid panel, TSH and Hemoglobin A1c are normal. PRL 21.7.   7. EKG. Normal sisnus rhythm. QT 330.   8. Cannabis  use. The patient minimizes her problem and declines treatment.  9. Disposition. She will be discharged to home with family. She will follow up with [facility].  Orson Slick, MD 02/16/2016, 1:30 PM

## 2016-02-16 NOTE — Plan of Care (Signed)
Problem: Activity: Goal: Sleeping patterns will improve Outcome: Progressing Patient reports that she is feeling more rested. No sign of distress

## 2016-02-16 NOTE — Progress Notes (Signed)
Recreation Therapy Notes  Date: 10.19.17 Time: 9:30 am Location: Craft Room  Group Topic: Leisure Education  Goal Area(s) Addresses:  Patient will write at least one healthy coping skill. Patient will identify positive emotions.  Behavioral Response: Attentive, Interactive  Intervention: Leisure Time Clock  Activity: Patients were instructed to write 2 healthy leisure activities. Patients were then given a Leisure Time Clock worksheet and instructed to complete it. Patients were instructed to identify 10 positive emotions. Patients were instructed to match the healthy leisure activities with the positive emotions.  Education: LRT educated patients on what they need to participate in leisure.  Education Outcome: Acknowledges education/In group clarification offered  Clinical Observations/Feedback: Patient completed activity by writing 2 healthy leisure activities, completing the Leisure Time Clock worksheet, identifying positive emotions, and connecting the leisure activities to the emotions. Patient contributed to group discussion.  [name],Brandt Chaney M, LRT/CTRS 02/16/2016 10:39 AM

## 2016-02-16 NOTE — Progress Notes (Signed)
Patient is visible in the milieu. Currently in the dayroom with staff and peers. Alert and oriented. Pleasant and cooperative. Denies suicidal/homicidal thoughts. Denies hallucinations. Expressing readiness for discharge "I think I am doing better, I am feeling better". Patient maintains a positive attitude on the unit. No sign of discomfort noted. Support and encouragements offered. Safety precautions maintained.

## 2016-02-16 NOTE — Plan of Care (Signed)
Problem: Coping: Goal: Ability to verbalize frustrations and anger appropriately will improve Outcome: Progressing Working on coping skills  Handout given   

## 2016-02-16 NOTE — Progress Notes (Signed)
D: Patient compliant with medication  Given this am . Later in shift patient stated the Geodon was making her so  Sleepy ,D: Patient stated slept good last night .Stated appetite is good and energy level  Is normal. Stated concentration is good . Stated on Depression scale , hopeless and anxiety .( low 0-10 high) Denies suicidal  homicidal ideations  .  No auditory hallucinations  No pain concerns . Appropriate ADL'S. Interacting with peers and staff. Patient  Working on Pharmacologistcoping skills  Work sheet given  A: Encourage patient participation with Insurance account managerunit programming . Instruction  Given on  Medication , verbalize understanding. R: Voice no other concerns. Staff continue to monitor

## 2016-02-16 NOTE — Tx Team (Signed)
Interdisciplinary Treatment and Diagnostic Plan Update  02/16/2016 Time of Session: 11:30am Jocelyn Martin MRN: [id number]  Principal Diagnosis: Bipolar affective disorder, current episode manic with psychotic symptoms (HCC)  Secondary Diagnoses: Principal Problem:   Bipolar affective disorder, current episode manic with psychotic symptoms (HCC) Active Problems:   Benign essential HTN   Cannabis use disorder, severe, dependence (HCC)   Current Medications:  Current Facility-Administered Medications  Medication Dose Route Frequency Provider Last Rate Last Dose  . acetaminophen (TYLENOL) tablet 650 mg  650 mg Oral Q6H PRN Audery Amel, MD   650 mg at 02/15/16 0810  . alum & mag hydroxide-simeth (MAALOX/MYLANTA) 200-200-20 MG/5ML suspension 30 mL  30 mL Oral Q4H PRN Audery Amel, MD      . amLODipine (NORVASC) tablet 5 mg  5 mg Oral Daily Audery Amel, MD   5 mg at 02/16/16 0839  . cloNIDine (CATAPRES) tablet 0.1 mg  0.1 mg Oral Once PRN Jimmy Footman, MD      . hydrOXYzine (ATARAX/VISTARIL) tablet 25 mg  25 mg Oral TID PRN Audery Amel, MD   25 mg at 02/14/16 2137  . lithium carbonate (ESKALITH) CR tablet 450 mg  450 mg Oral Q12H Audery Amel, MD   450 mg at 02/16/16 0839  . magnesium hydroxide (MILK OF MAGNESIA) suspension 30 mL  30 mL Oral Daily PRN Audery Amel, MD      . temazepam (RESTORIL) capsule 15 mg  15 mg Oral QHS PRN Jolanta B Pucilowska, MD      . ziprasidone (GEODON) capsule 40 mg  40 mg Oral BID WC Jolanta B Pucilowska, MD   40 mg at 02/16/16 0839   PTA Medications: Prescriptions Prior to Admission  Medication Sig Dispense Refill Last Dose  . amLODipine (NORVASC) 10 MG tablet Take 1 tablet (10 mg total) by mouth daily. 30 tablet 0   . ARIPiprazole (ABILIFY) 15 MG tablet Take 1 tablet (15 mg total) by mouth at bedtime. 30 tablet 0   . CIPRODEX otic suspension Place 4 drops into both ears 2 (two) times daily.  0   . lithium carbonate (ESKALITH) 450 MG  CR tablet Take 1 tablet (450 mg total) by mouth every 12 (twelve) hours. 60 tablet 0   . loratadine (CLARITIN) 10 MG tablet Take 1 tablet (10 mg total) by mouth daily. 30 tablet 0     Patient Stressors: Financial difficulties Medication change or noncompliance  Patient Strengths: General fund of knowledge Supportive family/friends  Treatment Modalities: Medication Management, Group therapy, Case management,  1 to 1 session with clinician, Psychoeducation, Recreational therapy.   Physician Treatment Plan for Primary Diagnosis: Bipolar affective disorder, current episode manic with psychotic symptoms (HCC) Long Term Goal(s): Improvement in symptoms so as ready for discharge Improvement in symptoms so as ready for discharge   Short Term Goals: Ability to identify changes in lifestyle to reduce recurrence of condition will improve Ability to verbalize feelings will improve Ability to disclose and discuss suicidal ideas Ability to demonstrate self-control will improve Ability to identify and develop effective coping behaviors will improve Ability to maintain clinical measurements within normal limits will improve Compliance with prescribed medications will improve Ability to identify changes in lifestyle to reduce recurrence of condition will improve Ability to demonstrate self-control will improve Ability to identify triggers associated with substance abuse/mental health issues will improve  Medication Management: Evaluate patient's response, side effects, and tolerance of medication regimen.  Therapeutic Interventions: 1 to 1 sessions,  Unit Group sessions and Medication administration.  Evaluation of Outcomes: Progressing  Physician Treatment Plan for Secondary Diagnosis: Principal Problem:   Bipolar affective disorder, current episode manic with psychotic symptoms (HCC) Active Problems:   Benign essential HTN   Cannabis use disorder, severe, dependence (HCC)  Long Term Goal(s):  Improvement in symptoms so as ready for discharge Improvement in symptoms so as ready for discharge   Short Term Goals: Ability to identify changes in lifestyle to reduce recurrence of condition will improve Ability to verbalize feelings will improve Ability to disclose and discuss suicidal ideas Ability to demonstrate self-control will improve Ability to identify and develop effective coping behaviors will improve Ability to maintain clinical measurements within normal limits will improve Compliance with prescribed medications will improve Ability to identify changes in lifestyle to reduce recurrence of condition will improve Ability to demonstrate self-control will improve Ability to identify triggers associated with substance abuse/mental health issues will improve     Medication Management: Evaluate patient's response, side effects, and tolerance of medication regimen.  Therapeutic Interventions: 1 to 1 sessions, Unit Group sessions and Medication administration.  Evaluation of Outcomes: Progressing   RN Treatment Plan for Primary Diagnosis: Bipolar affective disorder, current episode manic with psychotic symptoms (HCC) Long Term Goal(s): Knowledge of disease and therapeutic regimen to maintain health will improve  Short Term Goals: Ability to remain free from injury will improve, Ability to verbalize frustration and anger appropriately will improve, Ability to demonstrate self-control, Ability to participate in decision making will improve, Ability to verbalize feelings will improve, Ability to identify and develop effective coping behaviors will improve and Compliance with prescribed medications will improve  Medication Management: RN will administer medications as ordered by provider, will assess and evaluate patient's response and provide education to patient for prescribed medication. RN will report any adverse and/or side effects to prescribing provider.  Therapeutic  Interventions: 1 on 1 counseling sessions, Psychoeducation, Medication administration, Evaluate responses to treatment, Monitor vital signs and CBGs as ordered, Perform/monitor CIWA, COWS, AIMS and Fall Risk screenings as ordered, Perform wound care treatments as ordered.  Evaluation of Outcomes: Progressing   LCSW Treatment Plan for Primary Diagnosis: Bipolar affective disorder, current episode manic with psychotic symptoms (HCC) Long Term Goal(s): Safe transition to appropriate next level of care at discharge, Engage patient in therapeutic group addressing interpersonal concerns.  Short Term Goals: Engage patient in aftercare planning with referrals and resources, Increase social support, Increase ability to appropriately verbalize feelings, Increase emotional regulation, Facilitate acceptance of mental health diagnosis and concerns, Facilitate patient progression through stages of change regarding substance use diagnoses and concerns, Identify triggers associated with mental health/substance abuse issues and Increase skills for wellness and recovery  Therapeutic Interventions: Assess for all discharge needs, 1 to 1 time with Social worker, Explore available resources and support systems, Assess for adequacy in community support network, Educate family and significant other(s) on suicide prevention, Complete Psychosocial Assessment, Interpersonal group therapy.  Evaluation of Outcomes: Progressing   Progress in Treatment: Attending groups: No. CSW still assessing, pt new to milieu Participating in groups: No. CSW still assessing, pt new to milieu Taking medication as prescribed: Yes. Toleration medication: No. Family/Significant other contact made: No, will contact:  family Patient understands diagnosis: Yes. Discussing patient identified problems/goals with staff: Yes. Medical problems stabilized or resolved: Yes. Denies suicidal/homicidal ideation: Yes. Issues/concerns per patient  self-inventory: No. Other: None listed  New problem(s) identified: No, Describe:  none listed  New Short Term/Long Term Goal(s):  Discharge Plan or Barriers: CSW still assessing for appropriate referrals   Reason for Continuation of Hospitalization: Anxiety Delusions  Depression Hallucinations Mania Withdrawal symptoms  Estimated Length of Stay: 3-5 days   Attendees: Patient:  Jocelyn Martin 02/16/2016 10:57 AM  Physician: Dr. Jennet MaduroPucilowska, MD 02/16/2016 10:57 AM  Nursing: Leonia ReaderPhyllis Cobb, RN 02/16/2016 10:57 AM  RN Care Manager: 02/16/2016 10:57 AM  Social Worker: Dorothe PeaJonathan F. Roylene ReasonRiffey, LCSWA, LCAS 02/16/2016 10:57 AM  Recreational Therapist: Hershal CoriaBeth Greene, LRT 02/16/2016 10:57 AM  Other:  02/16/2016 10:57 AM  Other:  02/16/2016 10:57 AM  Other: 02/16/2016 10:57 AM    Scribe for Treatment Team: Dorothe PeaJonathan F Meranda Dechaine, LCSWA 02/16/2016

## 2016-02-17 MED ORDER — ZIPRASIDONE HCL 40 MG PO CAPS
80.0000 mg | ORAL_CAPSULE | Freq: Every day | ORAL | Status: DC
  Administered 2016-02-17 – 2016-02-18 (×2): 80 mg via ORAL
  Filled 2016-02-17 (×3): qty 2

## 2016-02-17 NOTE — Progress Notes (Signed)
Patient attended group and was appropriate. Pleasant and cooperative. Had a snack and received her bed time medication. Had no concern. Currently in bed. No sign of distress noted.

## 2016-02-17 NOTE — Plan of Care (Signed)
Problem: Safety: Goal: Ability to remain free from injury will improve Outcome: Progressing Patient's safety maintained

## 2016-02-17 NOTE — Plan of Care (Signed)
Problem: Health Behavior/Discharge Planning: Goal: Compliance with treatment plan for underlying cause of condition will improve Outcome: Progressing Patient compliant with medications and treatment

## 2016-02-17 NOTE — Progress Notes (Signed)
Patient is pleasant & cooperative.Rated her depression & anxiety 0/10.Denies suicidal or homicidal ideations.Appropriate with staff & peers.Compliant with medicines & groups.Appetite & energy level good.Support & encouragement given.

## 2016-02-17 NOTE — Progress Notes (Signed)
Recreation Therapy Notes  Date: 10.20.17 Time: 9:30 am Location: Craft Room  Group Topic: Coping Skills  Goal Area(s) Addresses:  Patient will participate in healthy coping skill. Patient will verbalize benefit of using art as a coping skill.  Behavioral Response: Attentive, Interactive  Intervention: Coloring  Activity: Patients were given coloring sheets to color and were instructed to think about the emotions they were feeling and what their minds were focused on.  Education: LRT educated patients on healthy coping skills.  Education Outcome: Acknowledges education/In group clarification offered  Clinical Observations/Feedback: Patient colored coloring sheet. Patient contributed to group discussion by stating what makes art a good coping skill, what emotions she was feeling, what her mind was focused on, and why using healthy coping skills is important.  [name],Verdie Wilms M, LRT/CTRS 02/17/2016 10:28 AM

## 2016-02-17 NOTE — Progress Notes (Signed)
Patient went to bed and slept well throughout the night. Had no sign of discomfort. Staff continue to monitor.

## 2016-02-17 NOTE — [facility] Group Notes (Signed)
[facility] LCSW Group Therapy   02/16/2016 1pm *Late Entry  Type of Therapy: Group Therapy   Participation Level: Active   Participation Quality: Attentive, Sharing and Supportive   Affect: Appropriate   Cognitive: Alert and Oriented   Insight: Developing/Improving and Engaged   Engagement in Therapy: Developing/Improving and Engaged   Modes of Intervention: Clarification, Confrontation, Discussion, Education, Exploration, Limit-setting, Orientation, Problem-solving, Rapport Building, Dance movement psychotherapisteality Testing, Socialization and Support   Summary of Progress/Problems: The topic for group was balance in life. Today's group focused on defining balance in one's own words, identifying things that can knock one off balance, and exploring healthy ways to maintain balance in life. Group members were asked to provide an example of a time when they felt off balance, describe how they handled that situation, and process healthier ways to regain balance in the future. Group members were asked to share the most important tool for maintaining balance that [facility] and how they plan to apply this method after discharge. Pt reported to CSW that the pt attained balance in life by "centering herself".  Pt shared that in addition the pt felt that with "creativity in general" the pt could overcome her barrier to feeling balanced which was just sitting by herself.  Pt was polite and cooperative with the CSW and other group members and focused and attentive to the topics discussed and the sharing of others.      Dorothe PeaJonathan F. Meghna Hagmann, LCSWA, LCAS

## 2016-02-17 NOTE — [facility] Group Notes (Signed)
[facility] LCSW Group Therapy   02/17/2016 9:30 AM   Type of Therapy: Group Therapy   Participation Level: Active   Participation Quality: Attentive, Sharing and Supportive   Affect: Appropriate   Cognitive: Alert and Oriented   Insight: Developing/Improving and Engaged   Engagement in Therapy: Developing/Improving and Engaged   Modes of Intervention: Clarification, Confrontation, Discussion, Education, Exploration, Limit-setting, Orientation, Problem-solving, Rapport Building, Dance movement psychotherapisteality Testing, Socialization and Support   Summary of Progress/Problems: The topic for today was feelings about relapse. Pt discussed what relapse prevention is to them and identified triggers that they are on the path to relapse. Pt processed their feeling towards relapse and was able to relate to peers. Pt discussed coping skills that can be used for relapse prevention.  Pt shared that for the pt relapse meant falling away from your path".  Pt shared that for the pt a barrier to relapse might be "getting back on my feet" and being in a healthy relationship with "someone like I do with my boyfriend".   Dorothe PeaJonathan F. Tellis Spivak, MSW, LCSWA, LCAS

## 2016-02-17 NOTE — Progress Notes (Signed)
[facility]  02/17/2016 8:28 AM Limmie Patriciamie Eastland  MRN:  [id number]  Subjective:  Jocelyn Martin seems better today. She is not as euphoric, intrussive or grandiose. She still gives me advice. Testerday she felt very sleepy from Geodon and we had to give her a dose at night rather than during the day. She slept 6.5 hours. There are no somatic complaints. Good program participation. No behavioral problems.   Principal Problem: Bipolar affective disorder, current episode manic with psychotic symptoms (HCC) Diagnosis:   Patient Active Problem List   Diagnosis Date Noted  . Bipolar affective disorder, current episode manic with psychotic symptoms (HCC) [F31.2] 02/14/2016  . Benign essential HTN [I10] 10/20/2014  . Cannabis use disorder, severe, dependence (HCC) [F12.20] 10/20/2014   Total Time spent with patient: 20 minutes  Past Psychiatric History: Bipolar disorder.  Past Medical History:  Past Medical History:  Diagnosis Date  . Bipolar disord, crnt episode mixed, severe, w psych features (HCC)   . PTSD (post-traumatic stress disorder)     Past Surgical History:  Procedure Laterality Date  . ankle Left   . INNER EAR SURGERY     Family History: History reviewed. No pertinent family history. Family Psychiatric  History: See H&P. Social History:  History  Alcohol Use  . Yes    Comment: rare     History  Drug Use  . Types: Marijuana    Comment: 3-4 bowels a day    Social History   Social History  . Marital status: Single    Spouse name: N/A  . Number of children: N/A  . Years of education: N/A   Social History Main Topics  . Smoking status: Former Games developermoker  . Smokeless tobacco: Never Used  . Alcohol use Yes     Comment: rare  . Drug use:     Types: Marijuana     Comment: 3-4 bowels a day  . Sexual activity: Not Asked   Other Topics Concern  . None   Social History Narrative  . None   Additional Social History:                         Sleep:  Fair  Appetite:  Fair  Current Medications: Current Facility-Administered Medications  Medication Dose Route Frequency Provider Last Rate Last Dose  . acetaminophen (TYLENOL) tablet 650 mg  650 mg Oral Q6H PRN Audery AmelJohn T Clapacs, MD   650 mg at 02/15/16 0810  . alum & mag hydroxide-simeth (MAALOX/MYLANTA) 200-200-20 MG/5ML suspension 30 mL  30 mL Oral Q4H PRN Audery AmelJohn T Clapacs, MD      . amLODipine (NORVASC) tablet 5 mg  5 mg Oral Daily Audery AmelJohn T Clapacs, MD   5 mg at 02/17/16 0807  . cloNIDine (CATAPRES) tablet 0.1 mg  0.1 mg Oral Once PRN Jimmy FootmanAndrea Hernandez-Gonzalez, MD      . hydrOXYzine (ATARAX/VISTARIL) tablet 25 mg  25 mg Oral TID PRN Audery AmelJohn T Clapacs, MD   25 mg at 02/14/16 2137  . lithium carbonate (ESKALITH) CR tablet 450 mg  450 mg Oral Q12H Audery AmelJohn T Clapacs, MD   450 mg at 02/17/16 0810  . magnesium hydroxide (MILK OF MAGNESIA) suspension 30 mL  30 mL Oral Daily PRN Audery AmelJohn T Clapacs, MD      . temazepam (RESTORIL) capsule 15 mg  15 mg Oral QHS PRN Sakiya Stepka B Emiah Pellicano, MD      . ziprasidone (GEODON) capsule 40 mg  40 mg Oral  QHS Shari Prows, MD   40 mg at 02/16/16 2127    Lab Results:  No results found for this or any previous visit (from the past 48 hour(s)).  Blood Alcohol level:  Lab Results  Component Value Date   ETH <5 02/13/2016   ETH <5 10/18/2014    Metabolic Disorder Labs: Lab Results  Component Value Date   HGBA1C 5.5 02/15/2016   MPG 111 02/15/2016   Lab Results  Component Value Date   PROLACTIN 21.7 02/15/2016   Lab Results  Component Value Date   CHOL 186 02/15/2016   TRIG 142 02/15/2016   HDL 33 (L) 02/15/2016   CHOLHDL 5.6 02/15/2016   VLDL 28 02/15/2016   LDLCALC 125 (H) 02/15/2016   LDLCALC 116 (H) 10/19/2014    Physical Findings: AIMS:  , ,  ,  ,    CIWA:    COWS:     Musculoskeletal: Strength & Muscle Tone: within normal limits Gait & Station: normal Patient leans: N/A  Psychiatric Specialty Exam: Physical Exam  Nursing note and  vitals reviewed.   Review of Systems  Psychiatric/Behavioral: Positive for hallucinations.  All other systems reviewed and are negative.   Blood pressure (!) 152/95, pulse 96, temperature 97.9 F (36.6 C), temperature source Oral, resp. rate 20, height 5\' 4"  (1.626 m), weight 126.1 kg (278 lb), last menstrual period 02/13/2016, SpO2 100 %.Body mass index is 47.72 kg/m.  General Appearance: Casual  Eye Contact:  Good  Speech:  Pressured  Volume:  Increased  Mood:  Euphoric  Affect:  Congruent  Thought Process:  Goal Directed and Descriptions of Associations: Intact  Orientation:  Full (Time, Place, and Person)  Thought Content:  Delusions, Hallucinations: Auditory and Paranoid Ideation  Suicidal Thoughts:  No  Homicidal Thoughts:  No  Memory:  Immediate;   Fair Recent;   Fair Remote;   Fair  Judgement:  Impaired  Insight:  Lacking  Psychomotor Activity:  Increased  Concentration:  Concentration: Fair and Attention Span: Fair  Recall:  [employer] of Knowledge:  Fair  Language:  Fair  Akathisia:  No  Handed:  Right  AIMS (if indicated):     Assets:  Communication Skills Desire for Improvement Financial Resources/Insurance Housing Intimacy Physical Health Resilience Social Support  ADL's:  Intact  Cognition:  WNL  Sleep:  Number of Hours: 6.5     Treatment Plan Summary: Daily contact with patient to assess and evaluate symptoms and progress in treatment and Medication management   Jocelyn Martin is a 30 year old female with a history of bipolar illness admitted in a manic, psychotic episode in the context of treatment noncompliance.  1. Agitation. This has resolved.  2. Mood and psychosis. She was restarted on lithium for mood stabilization and Abilify for psychosis. She refuses to take Abilify as she thinks it may have extremely sleepy. She agreed to try Geodon. We were unable to increase Geodon as she feels sleepy. Lithium level in am   3. Insomnia. She is on  Restoril.  4. Anxiety. Hydroxyzine is available.  5. Hypertension. She is on clonidine and Norvasc.  6. Metabolic syndrome monitoring. Lipid panel, TSH and Hemoglobin A1c are normal. PRL 21.7.   7. EKG. Normal sisnus rhythm. QT 330.   8. Cannabis use. The patient minimizes her problem and declines treatment.  9. Disposition. She will be discharged to home with family. She will follow up with [facility].  Kristine Linea, MD 02/17/2016, 8:28 AM

## 2016-02-18 MED ORDER — AMOXICILLIN 250 MG PO CAPS
500.0000 mg | ORAL_CAPSULE | Freq: Two times a day (BID) | ORAL | Status: DC
  Administered 2016-02-18 – 2016-02-19 (×3): 500 mg via ORAL
  Filled 2016-02-18 (×3): qty 2

## 2016-02-18 MED ORDER — CIPROFLOXACIN-DEXAMETHASONE 0.3-0.1 % OT SUSP
4.0000 [drp] | Freq: Two times a day (BID) | OTIC | Status: DC
  Administered 2016-02-18 – 2016-02-19 (×4): 4 [drp] via OTIC
  Filled 2016-02-18: qty 7.5

## 2016-02-18 MED ORDER — LIDOCAINE VISCOUS 2 % MT SOLN
15.0000 mL | Freq: Four times a day (QID) | OROMUCOSAL | Status: DC | PRN
  Filled 2016-02-18: qty 15

## 2016-02-18 NOTE — [facility] Group Notes (Signed)
[facility] LCSW Group Therapy  02/18/2016 3:15 PM  Type of Therapy:  Group Therapy  Participation Level:  Active  Participation Quality:  Appropriate  Affect:  Appropriate  Cognitive:  Alert  Insight:  Developing/Improving  Engagement in Therapy:  Engaged  Modes of Intervention:  Activity, Discussion, Education and Support  Summary of Progress/Problems:Coping Skills: Patients defined and discussed healthy coping skills. Patients identified healthy coping skills they would like to try during hospitalization and after discharge. CSW offered insight to varying coping skills that may have been new to patients such as practicing mindfulness.  [name] 02/18/2016, 3:15 PM

## 2016-02-18 NOTE — Progress Notes (Signed)
Pt denies SI/HI/AVH. Pleasant and during interaction. Attended evening group. Seen in milieu interacting with peers and staff appropriately. Pt woke up at 0535 and stated that her left ear felt "clogged" and that she had been on Amoxicillin and ear drops but stopped taking it. Pt states she feels it is becoming "infected again". Denies pain at this time.  PTA medications show Pt had a prescription filled in [place] for eardrops BID. Oncoming shift notified.

## 2016-02-18 NOTE — Plan of Care (Signed)
Problem: Coping: Goal: Ability to cope will improve Outcome: Progressing Pt improvement noted. Denies SI/HI/AVH. Calm, cooperative, and pleasant. Attends groups. Uses coping skills.

## 2016-02-18 NOTE — Progress Notes (Signed)
Pt awake, alert, calm, cooperative, pleasant, up on unit today interacting appropriately with peers. Denies SI/HI/AVH. Complained of earache, antibiotics PO and eardrops ordered, administered. Denies pain this afternoon. Attends groups. Medication complaint. Pt anticipating discharge tomorrow.   Safety maintained with every 15 minute checks. Support and encouragement provided. Medications administered as ordered with education. Will continue to monitor.

## 2016-02-18 NOTE — Progress Notes (Signed)
[facility] MD Progress Note  02/18/2016 12:47 PM Jocelyn Martin  MRN:  [id number]  Subjective:  Ms. Jocelyn Martin continues to improve. She is not longer pressured. Paranoid delusions had resolved. She is pleasant polite and cooperative.. She interacts appropriately with peers and staff. She accepts medications and tolerates them well. She complains of ear ache today. She did see her primary provider a few days prior to admission who prescribed eardrops and amoxicillin. The patient reports that she has not been taking her medications. We will start today. She also complains of oligomenorrhea. Her last period started a few days ago and still going. She is anxious to see her OB/GYN. Social worker spoke with her boyfriend who has been talking to the patient on a regular basis and noticed much improvement. Good program participation. No behavioral problems.   Principal Problem: Bipolar affective disorder, current episode manic with psychotic symptoms (HCC) Diagnosis:   Patient Active Problem List   Diagnosis Date Noted  . Bipolar affective disorder, current episode manic with psychotic symptoms (HCC) [F31.2] 02/14/2016  . Benign essential HTN [I10] 10/20/2014  . Cannabis use disorder, severe, dependence (HCC) [F12.20] 10/20/2014   Total Time spent with patient: 20 minutes  Past Psychiatric History: Bipolar disorder.  Past Medical History:  Past Medical History:  Diagnosis Date  . Bipolar disord, crnt episode mixed, severe, w psych features (HCC)   . PTSD (post-traumatic stress disorder)     Past Surgical History:  Procedure Laterality Date  . ankle Left   . INNER EAR SURGERY     Family History: History reviewed. No pertinent family history. Family Psychiatric  History: See H&P. Social History:  History  Alcohol Use  . Yes    Comment: rare     History  Drug Use  . Types: Marijuana    Comment: 3-4 bowels a day    Social History   Social History  . Marital status: Single    Spouse name: N/A  .  Number of children: N/A  . Years of education: N/A   Social History Main Topics  . Smoking status: Former Games developer  . Smokeless tobacco: Never Used  . Alcohol use Yes     Comment: rare  . Drug use:     Types: Marijuana     Comment: 3-4 bowels a day  . Sexual activity: Not Asked   Other Topics Concern  . None   Social History Narrative  . None   Additional Social History:                         Sleep: Fair  Appetite:  Fair  Current Medications: Current Facility-Administered Medications  Medication Dose Route Frequency Provider Last Rate Last Dose  . acetaminophen (TYLENOL) tablet 650 mg  650 mg Oral Q6H PRN Audery Amel, MD   650 mg at 02/18/16 0755  . alum & mag hydroxide-simeth (MAALOX/MYLANTA) 200-200-20 MG/5ML suspension 30 mL  30 mL Oral Q4H PRN Audery Amel, MD      . amLODipine (NORVASC) tablet 5 mg  5 mg Oral Daily Audery Amel, MD   5 mg at 02/18/16 0754  . amoxicillin (AMOXIL) capsule 500 mg  500 mg Oral Q12H Lc Joynt B Arafat Cocuzza, MD   500 mg at 02/18/16 1032  . ciprofloxacin-dexamethasone (CIPRODEX) 0.3-0.1 % otic suspension 4 drop  4 drop Both Ears BID Tamyra Fojtik B Josslin Sanjuan, MD   4 drop at 02/18/16 1031  . cloNIDine (CATAPRES) tablet 0.1 mg  0.1 mg Oral Once PRN Jimmy FootmanAndrea Hernandez-Gonzalez, MD      . hydrOXYzine (ATARAX/VISTARIL) tablet 25 mg  25 mg Oral TID PRN Audery AmelJohn T Clapacs, MD   25 mg at 02/14/16 2137  . lidocaine (XYLOCAINE) 2 % viscous mouth solution 15 mL  15 mL Mouth/Throat Q6H PRN Ulises Wolfinger B Yarel Kilcrease, MD      . lithium carbonate (ESKALITH) CR tablet 450 mg  450 mg Oral Q12H Audery AmelJohn T Clapacs, MD   450 mg at 02/18/16 0754  . magnesium hydroxide (MILK OF MAGNESIA) suspension 30 mL  30 mL Oral Daily PRN Audery AmelJohn T Clapacs, MD      . temazepam (RESTORIL) capsule 15 mg  15 mg Oral QHS PRN Laquida Cotrell B Ruel Dimmick, MD      . ziprasidone (GEODON) capsule 80 mg  80 mg Oral QHS Shari ProwsJolanta B Glynn Freas, MD   80 mg at 02/17/16 2104    Lab Results:  No results found  for this or any previous visit (from the past 48 hour(s)).  Blood Alcohol level:  Lab Results  Component Value Date   ETH <5 02/13/2016   ETH <5 10/18/2014    Metabolic Disorder Labs: Lab Results  Component Value Date   HGBA1C 5.5 02/15/2016   MPG 111 02/15/2016   Lab Results  Component Value Date   PROLACTIN 21.7 02/15/2016   Lab Results  Component Value Date   CHOL 186 02/15/2016   TRIG 142 02/15/2016   HDL 33 (L) 02/15/2016   CHOLHDL 5.6 02/15/2016   VLDL 28 02/15/2016   LDLCALC 125 (H) 02/15/2016   LDLCALC 116 (H) 10/19/2014    Physical Findings: AIMS:  , ,  ,  ,    CIWA:    COWS:     Musculoskeletal: Strength & Muscle Tone: within normal limits Gait & Station: normal Patient leans: N/A  Psychiatric Specialty Exam: Physical Exam  Nursing note and vitals reviewed.   Review of Systems  HENT: Positive for ear pain.   All other systems reviewed and are negative.   Blood pressure 128/80, pulse 94, temperature 98.4 F (36.9 C), temperature source Oral, resp. rate 18, height 5\' 4"  (1.626 m), weight 126.1 kg (278 lb), last menstrual period 02/13/2016, SpO2 100 %.Body mass index is 47.72 kg/m.  General Appearance: Casual  Eye Contact:  Good  Speech:  Pressured  Volume:  Increased  Mood:  Euphoric  Affect:  Congruent  Thought Process:  Goal Directed and Descriptions of Associations: Intact  Orientation:  Full (Time, Place, and Person)  Thought Content:  Delusions, Hallucinations: Auditory and Paranoid Ideation  Suicidal Thoughts:  No  Homicidal Thoughts:  No  Memory:  Immediate;   Fair Recent;   Fair Remote;   Fair  Judgement:  Impaired  Insight:  Lacking  Psychomotor Activity:  Increased  Concentration:  Concentration: Fair and Attention Span: Fair  Recall:  [employer]  Fund of Knowledge:  Fair  Language:  Fair  Akathisia:  No  Handed:  Right  AIMS (if indicated):     Assets:  Communication Skills Desire for Improvement Financial  Resources/Insurance Housing Intimacy Physical Health Resilience Social Support  ADL's:  Intact  Cognition:  WNL  Sleep:  Number of Hours: 5.45     Treatment Plan Summary: Daily contact with patient to assess and evaluate symptoms and progress in treatment and Medication management   Ms. Biondolillo is a 30 year old female with a history of bipolar illness admitted in a manic, psychotic episode in the context of treatment  noncompliance.  1. Agitation. This has resolved.  2. Mood and psychosis. She was restarted on lithium for mood stabilization. She refused Abilify for psychosis but agreed to try Geodon. She tolerates it well. Lithium level in am   3. Insomnia. She is on Restoril.  4. Anxiety. Hydroxyzine is available.  5. Hypertension. She is on clonidine and Norvasc.  6. Metabolic syndrome monitoring. Lipid panel, TSH and Hemoglobin A1c are normal. PRL 21.7.   7. EKG. Normal sisnus rhythm. QT 330.   8. Cannabis use. The patient minimizes her problem and declines treatment.  9. Disposition. She will be discharged to home with family. She will follow up with [facility].  Kristine Linea, MD 02/18/2016, 12:47 PM

## 2016-02-18 NOTE — [facility] Suicide Risk Assessment (Signed)
[facility] INPATIENT:  Family/Significant Other Suicide Prevention Education  Suicide Prevention Education:  Education Completed;Irving Burtonhomas Love( boyfriend [phone]), has been identified by the patient as the family member/significant other with whom the patient will be residing, and identified as the person(s) who will aid the patient in the event of a mental health crisis (suicidal ideations/suicide attempt).  With written consent from the patient, the family member/significant other has been provided the following suicide prevention education, prior to the and/or following the discharge of the patient.  The suicide prevention education provided includes the following:  Suicide risk factors  Suicide prevention and interventions  National Suicide Hotline telephone number  [facility] assessment telephone number  [facility] Emergency Assistance 911  [facility] and/or Residential Mobile Crisis Unit telephone number  Request made of family/significant other to:  Remove weapons (e.g., guns, rifles, knives), all items previously/currently identified as safety concern.    Remove drugs/medications (over-the-counter, prescriptions, illicit drugs), all items previously/currently identified as a safety concern.  The family member/significant other verbalizes understanding of the suicide prevention education information provided.  The family member/significant other agrees to remove the items of safety concern listed above.  Zana Biancardi G. Garnette CzechSampson MSW, LCSWA 02/18/2016, 11:43 AM

## 2016-02-19 LAB — LITHIUM LEVEL: Lithium Lvl: 0.45 mmol/L — ABNORMAL LOW (ref 0.60–1.20)

## 2016-02-19 MED ORDER — LITHIUM CARBONATE ER 300 MG PO TBCR: 600.0000 mg | EXTENDED_RELEASE_TABLET | Freq: Two times a day (BID) | ORAL | Status: DC

## 2016-02-19 MED ORDER — LITHIUM CARBONATE ER 450 MG PO TBCR: 450.0000 mg | EXTENDED_RELEASE_TABLET | Freq: Two times a day (BID) | ORAL | 1 refills | Status: DC

## 2016-02-19 MED ORDER — TEMAZEPAM 15 MG PO CAPS: 15.0000 mg | ORAL_CAPSULE | Freq: Every evening | ORAL | 0 refills | Status: DC | PRN

## 2016-02-19 MED ORDER — ZIPRASIDONE HCL 80 MG PO CAPS: 80.0000 mg | ORAL_CAPSULE | Freq: Every day | ORAL | 1 refills | Status: DC

## 2016-02-19 MED ORDER — AMLODIPINE BESYLATE 10 MG PO TABS: 10.0000 mg | ORAL_TABLET | Freq: Every day | ORAL | 0 refills | Status: DC

## 2016-02-19 MED ORDER — LITHIUM CARBONATE ER 300 MG PO TBCR: 600.0000 mg | EXTENDED_RELEASE_TABLET | Freq: Two times a day (BID) | ORAL | 1 refills | Status: DC

## 2016-02-19 NOTE — [facility] Suicide Risk Assessment (Signed)
[facility]   Principal Problem: Bipolar affective disorder, current episode manic with psychotic symptoms [facility](HCC) Discharge Diagnoses:  Patient Active Problem List   Diagnosis Date Noted  . Bipolar affective disorder, current episode manic with psychotic symptoms (HCC) [F31.2] 02/14/2016  . Benign essential HTN [I10] 10/20/2014  . Cannabis use disorder, severe, dependence (HCC) [F12.20] 10/20/2014    Total Time spent with patient: 30 minutes  Musculoskeletal: Strength & Muscle Tone: within normal limits Gait & Station: normal Patient leans: N/A  Psychiatric Specialty Exam: Review of Systems  Psychiatric/Behavioral: The patient is nervous/anxious.   All other systems reviewed and are negative.   Blood pressure (!) 145/95, pulse 91, temperature 98 F (36.7 C), resp. rate 18, height 5\' 4"  (1.626 m), weight 126.1 kg (278 lb), last menstrual period 02/13/2016, SpO2 100 %.Body mass index is 47.72 kg/m.  General Appearance: Casual  Eye Contact::  Good  Speech:  Clear and Coherent409  Volume:  Normal  Mood:  Anxious  Affect:  Appropriate  Thought Process:  Goal Directed  Orientation:  Full (Time, Place, and Person)  Thought Content:  WDL  Suicidal Thoughts:  No  Homicidal Thoughts:  No  Memory:  Immediate;   Fair Recent;   Fair Remote;   Fair  Judgement:  Impaired  Insight:  Present  Psychomotor Activity:  Normal  Concentration:  Fair  Recall:  [employer]Fair  Fund of Knowledge:Fair  Language: Fair  Akathisia:  No  Handed:  Right  AIMS (if indicated):     Assets:  Communication Skills Desire for Improvement Financial Resources/Insurance Housing Intimacy Physical Health Resilience Social Support Transportation  Sleep:  Number of Hours: 5.45  Cognition: WNL  ADL's:  Intact   Mental Status Per Nursing Assessment::   On Admission:  NA  Demographic Factors:  Caucasian  Loss Factors: NA  Historical Factors: Family history of mental illness or  substance abuse and Impulsivity  Risk Reduction Factors:   Sense of responsibility to family, Living with another person, especially a relative, Positive social support and Positive therapeutic relationship  Continued Clinical Symptoms:  Bipolar Disorder:   Mixed State  Cognitive Features That Contribute To Risk:  None    Suicide Risk:  Minimal: No identifiable suicidal ideation.  Patients presenting with no risk factors but with morbid ruminations; may be classified as minimal risk based on the severity of the depressive symptoms  Follow-up Information    [facility] .   Why:      Please arrive to the walk-in clinic at discharge between the hours of 8am-4pm for your hospital follow up and for medication managment, substance abuse treatment and therapy.  Arrive as early as possible for prompt service.  Contact information:  [facility] [address] [place], [place] [address] Phone: [phone] Fax: [phone]          Plan Of Care/Follow-up recommendations:  Activity:  as tolerated. Diet:  low sodium heart healthy. Other:  keep follow up appointmnts.  Kristine LineaJolanta Pucilowska, MD 02/19/2016, 7:58 AM
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
## 2016-02-19 NOTE — Progress Notes (Signed)
Patient denies SI/HI, denies A/V hallucinations. Patient verbalizes understanding of discharge instructions, follow up care and prescriptions. Patient given all belongings from personal locker. Patient escorted out by staff.  
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
## 2016-02-19 NOTE — Progress Notes (Signed)
Pt denies SI/HI/AVH. Medication compliant. Stated her ears were already feeling better. Attended evening group. Visible in milieu interacting appropriately with staff and peers. Voices no additional concerns at this time. Safety maintained. Will continue to monitor.

## 2016-02-19 NOTE — Progress Notes (Signed)
  [facility] :  Will you be returning to the same living situation after discharge:  Yes,  with boyfriend At discharge, do you have transportation home?: Yes,  boyfriend Do you have the ability to pay for your medications: Yes,  patient states no concerns  Release of information consent forms completed and in the chart;  Patient's signature needed at discharge.  Patient to Follow up at: Follow-up Information    [facility] .   Why:      Please arrive to the walk-in clinic at discharge between the hours of 8am-4pm for your hospital follow up and for medication managment, substance abuse treatment and therapy.  Arrive as early as possible for prompt service.  Contact information:  [facility] [address] [place], [place] [address] Phone: [phone] Fax: [phone]          Next level of care provider has access to [facility] Link:no  Safety Planning and Suicide Prevention discussed: Yes,  with boyfriend  Have you used any form of tobacco in the last 30 days? (Cigarettes, Smokeless Tobacco, Cigars, and/or Pipes): No  Has patient been referred to the Quitline?: N/A patient is not a smoker  Patient has been referred for addiction treatment: Yes  Tiney Zipper G. Garnette CzechSampson MSW, LCSWA 02/19/2016, 10:00 AM

## 2016-02-19 NOTE — [facility] Group Notes (Signed)
[facility] LCSW Group Therapy  02/19/2016 3:02 PM  Type of Therapy:  Group Therapy  Participation Level:  Active  Participation Quality:  Appropriate  Affect:  Appropriate  Cognitive:  Alert  Insight:  Developing/Improving  Engagement in Therapy:  Developing/Improving  Modes of Intervention:  Discussion, Education and Support  Summary of Progress/Problems: Communications: Patients identify how individuals communicate with one another appropriately and inappropriately. Patients will be guided to discuss their thoughts, feelings, and behaviors related to barriers when communicating. The group will process together ways to execute positive and appropriate communications.   Zeniya Lapidus G. Garnette CzechSampson MSW, LCSWA 02/19/2016, 3:03 PM

## 2016-02-19 NOTE — Tx Team (Signed)
Interdisciplinary Treatment and Diagnostic Plan Update  02/19/2016 Time of Session: 10:00am Jocelyn Martin MRN: [id number]  Principal Diagnosis: Bipolar affective disorder, current episode manic with psychotic symptoms (HCC)  Secondary Diagnoses: Principal Problem:   Bipolar affective disorder, current episode manic with psychotic symptoms (HCC) Active Problems:   Benign essential HTN   Cannabis use disorder, severe, dependence (HCC)   Current Medications:  Current Facility-Administered Medications  Medication Dose Route Frequency Provider Last Rate Last Dose  . acetaminophen (TYLENOL) tablet 650 mg  650 mg Oral Q6H PRN Audery Amel, MD   650 mg at 02/18/16 2009  . alum & mag hydroxide-simeth (MAALOX/MYLANTA) 200-200-20 MG/5ML suspension 30 mL  30 mL Oral Q4H PRN Audery Amel, MD      . amLODipine (NORVASC) tablet 5 mg  5 mg Oral Daily Audery Amel, MD   5 mg at 02/19/16 0843  . amoxicillin (AMOXIL) capsule 500 mg  500 mg Oral Q12H Jolanta B Pucilowska, MD   500 mg at 02/19/16 0842  . ciprofloxacin-dexamethasone (CIPRODEX) 0.3-0.1 % otic suspension 4 drop  4 drop Both Ears BID Shari Prows, MD   4 drop at 02/19/16 0845  . cloNIDine (CATAPRES) tablet 0.1 mg  0.1 mg Oral Once PRN Jimmy Footman, MD      . hydrOXYzine (ATARAX/VISTARIL) tablet 25 mg  25 mg Oral TID PRN Audery Amel, MD   25 mg at 02/14/16 2137  . lidocaine (XYLOCAINE) 2 % viscous mouth solution 15 mL  15 mL Mouth/Throat Q6H PRN Jolanta B Pucilowska, MD      . lithium carbonate (ESKALITH) CR tablet 450 mg  450 mg Oral Q12H Audery Amel, MD   450 mg at 02/19/16 0844  . magnesium hydroxide (MILK OF MAGNESIA) suspension 30 mL  30 mL Oral Daily PRN Audery Amel, MD      . temazepam (RESTORIL) capsule 15 mg  15 mg Oral QHS PRN Jolanta B Pucilowska, MD      . ziprasidone (GEODON) capsule 80 mg  80 mg Oral QHS Shari Prows, MD   80 mg at 02/18/16 2138   PTA Medications: Prescriptions Prior to  Admission  Medication Sig Dispense Refill Last Dose  . ARIPiprazole (ABILIFY) 15 MG tablet Take 1 tablet (15 mg total) by mouth at bedtime. 30 tablet 0   . CIPRODEX otic suspension Place 4 drops into both ears 2 (two) times daily.  0   . loratadine (CLARITIN) 10 MG tablet Take 1 tablet (10 mg total) by mouth daily. 30 tablet 0   . [DISCONTINUED] amLODipine (NORVASC) 10 MG tablet Take 1 tablet (10 mg total) by mouth daily. 30 tablet 0   . [DISCONTINUED] lithium carbonate (ESKALITH) 450 MG CR tablet Take 1 tablet (450 mg total) by mouth every 12 (twelve) hours. 60 tablet 0     Patient Stressors: Financial difficulties Medication change or noncompliance  Patient Strengths: General fund of knowledge Supportive family/friends  Treatment Modalities: Medication Management, Group therapy, Case management,  1 to 1 session with clinician, Psychoeducation, Recreational therapy.   Physician Treatment Plan for Primary Diagnosis: Bipolar affective disorder, current episode manic with psychotic symptoms (HCC) Long Term Goal(s): Improvement in symptoms so as ready for discharge Improvement in symptoms so as ready for discharge   Short Term Goals: Ability to identify changes in lifestyle to reduce recurrence of condition will improve Ability to verbalize feelings will improve Ability to disclose and discuss suicidal ideas Ability to demonstrate self-control will improve Ability  to identify and develop effective coping behaviors will improve Ability to maintain clinical measurements within normal limits will improve Compliance with prescribed medications will improve Ability to identify changes in lifestyle to reduce recurrence of condition will improve Ability to demonstrate self-control will improve Ability to identify triggers associated with substance abuse/mental health issues will improve  Medication Management: Evaluate patient's response, side effects, and tolerance of medication  regimen.  Therapeutic Interventions: 1 to 1 sessions, Unit Group sessions and Medication administration.  Evaluation of Outcomes: Adequate for Discharge  Physician Treatment Plan for Secondary Diagnosis: Principal Problem:   Bipolar affective disorder, current episode manic with psychotic symptoms (HCC) Active Problems:   Benign essential HTN   Cannabis use disorder, severe, dependence (HCC)  Long Term Goal(s): Improvement in symptoms so as ready for discharge Improvement in symptoms so as ready for discharge   Short Term Goals: Ability to identify changes in lifestyle to reduce recurrence of condition will improve Ability to verbalize feelings will improve Ability to disclose and discuss suicidal ideas Ability to demonstrate self-control will improve Ability to identify and develop effective coping behaviors will improve Ability to maintain clinical measurements within normal limits will improve Compliance with prescribed medications will improve Ability to identify changes in lifestyle to reduce recurrence of condition will improve Ability to demonstrate self-control will improve Ability to identify triggers associated with substance abuse/mental health issues will improve     Medication Management: Evaluate patient's response, side effects, and tolerance of medication regimen.  Therapeutic Interventions: 1 to 1 sessions, Unit Group sessions and Medication administration.  Evaluation of Outcomes: Adequate for Discharge   RN Treatment Plan for Primary Diagnosis: Bipolar affective disorder, current episode manic with psychotic symptoms (HCC) Long Term Goal(s): Knowledge of disease and therapeutic regimen to maintain health will improve  Short Term Goals: Ability to remain free from injury will improve, Ability to verbalize frustration and anger appropriately will improve, Ability to demonstrate self-control, Ability to participate in decision making will improve, Ability to  verbalize feelings will improve, Ability to identify and develop effective coping behaviors will improve and Compliance with prescribed medications will improve  Medication Management: RN will administer medications as ordered by provider, will assess and evaluate patient's response and provide education to patient for prescribed medication. RN will report any adverse and/or side effects to prescribing provider.  Therapeutic Interventions: 1 on 1 counseling sessions, Psychoeducation, Medication administration, Evaluate responses to treatment, Monitor vital signs and CBGs as ordered, Perform/monitor CIWA, COWS, AIMS and Fall Risk screenings as ordered, Perform wound care treatments as ordered.  Evaluation of Outcomes: Adequate for Discharge   LCSW Treatment Plan for Primary Diagnosis: Bipolar affective disorder, current episode manic with psychotic symptoms (HCC) Long Term Goal(s): Safe transition to appropriate next level of care at discharge, Engage patient in therapeutic group addressing interpersonal concerns.  Short Term Goals: Engage patient in aftercare planning with referrals and resources, Increase social support, Increase ability to appropriately verbalize feelings, Increase emotional regulation, Facilitate acceptance of mental health diagnosis and concerns, Facilitate patient progression through stages of change regarding substance use diagnoses and concerns, Identify triggers associated with mental health/substance abuse issues and Increase skills for wellness and recovery  Therapeutic Interventions: Assess for all discharge needs, 1 to 1 time with Social worker, Explore available resources and support systems, Assess for adequacy in community support network, Educate family and significant other(s) on suicide prevention, Complete Psychosocial Assessment, Interpersonal group therapy.  Evaluation of Outcomes: Adequate for Discharge   Progress in Treatment:  Attending groups:  Yes. Participating in groups: Yes. Taking medication as prescribed: Yes. Toleration medication: Yes. Family/Significant other contact made: Yes, individual(s) contacted:  boyfriend Patient understands diagnosis: Yes. Discussing patient identified problems/goals with staff: Yes. Medical problems stabilized or resolved: Yes. Denies suicidal/homicidal ideation: Yes. Issues/concerns per patient self-inventory: No. Other: n/a  New problem(s) identified: None identified at this time.   New Short Term/Long Term Goal(s): None identified at this time.   Discharge Plan or Barriers: Patient will discharge home with boyfriend and [facility].   Reason for Continuation of Hospitalization: Anticipated discharge 02/19/2016  Estimated Length of Stay: Anticipated discharge 02/19/2016  Attendees: Patient: Jocelyn Martin 02/19/2016 9:53 AM  Physician: Dr. Jennet MaduroPucilowska, MD 02/19/2016 9:53 AM  Nursing: Carlus Pavlovlifton Parker, RN 02/19/2016 9:53 AM  RN Care Manager: 02/19/2016 9:53 AM  Social Worker: Fredrich BirksAmaris G. Garnette CzechSampson MSW, LCSWA 02/19/2016 9:53 AM  Recreational Therapist:  02/19/2016 9:53 AM  Other:  02/19/2016 9:53 AM  Other:  02/19/2016 9:53 AM  Other: 02/19/2016 9:53 AM    Scribe for Treatment Team: Arelia LongestAmaris G Shanessa Hodak, LCSWA 02/19/2016 9:59 AM

## 2016-02-19 NOTE — Plan of Care (Signed)
Problem: Activity: Goal: Interest or engagement in activities will improve Outcome: Progressing Pt was putting puzzles together with peers. Appropriate during interaction.

## 2016-10-03 ENCOUNTER — Encounter: Payer: Self-pay | Admitting: Emergency Medicine

## 2016-10-03 ENCOUNTER — Emergency Department
Admission: EM | Admit: 2016-10-03 | Discharge: 2016-10-05 | Disposition: A | Discharge status: Other Acute Inpatient Hospital | Payer: Medicaid Other | Attending: Emergency Medicine | Admitting: Emergency Medicine

## 2016-10-03 DIAGNOSIS — I10 Essential (primary) hypertension: Secondary | ICD-10-CM | POA: Insufficient documentation

## 2016-10-03 DIAGNOSIS — F312 Bipolar disorder, current episode manic severe with psychotic features: Secondary | ICD-10-CM | POA: Diagnosis not present

## 2016-10-03 DIAGNOSIS — F319 Bipolar disorder, unspecified: Secondary | ICD-10-CM | POA: Insufficient documentation

## 2016-10-03 DIAGNOSIS — Z9119 Patient's noncompliance with other medical treatment and regimen: Secondary | ICD-10-CM

## 2016-10-03 DIAGNOSIS — Z91199 Patient's noncompliance with other medical treatment and regimen due to unspecified reason: Secondary | ICD-10-CM

## 2016-10-03 DIAGNOSIS — F919 Conduct disorder, unspecified: Secondary | ICD-10-CM | POA: Diagnosis not present

## 2016-10-03 DIAGNOSIS — F1220 Cannabis dependence, uncomplicated: Secondary | ICD-10-CM | POA: Diagnosis not present

## 2016-10-03 DIAGNOSIS — Z79899 Other long term (current) drug therapy: Secondary | ICD-10-CM | POA: Diagnosis not present

## 2016-10-03 DIAGNOSIS — R4689 Other symptoms and signs involving appearance and behavior: Secondary | ICD-10-CM
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
## 2016-10-04 DIAGNOSIS — Z9119 Patient's noncompliance with other medical treatment and regimen: Secondary | ICD-10-CM

## 2016-10-04 DIAGNOSIS — F312 Bipolar disorder, current episode manic severe with psychotic features: Secondary | ICD-10-CM

## 2016-10-04 DIAGNOSIS — Z91199 Patient's noncompliance with other medical treatment and regimen due to unspecified reason: Secondary | ICD-10-CM

## 2016-10-04 LAB — CBC
HEMATOCRIT: 39.5 % (ref 35.0–47.0)
HEMOGLOBIN: 13.4 g/dL (ref 12.0–16.0)
MCH: 27.4 pg (ref 26.0–34.0)
MCHC: 33.9 g/dL (ref 32.0–36.0)
MCV: 80.7 fL (ref 80.0–100.0)
Platelets: 283 10*3/uL (ref 150–440)
RBC: 4.9 MIL/uL (ref 3.80–5.20)
RDW: 14.9 % — AB (ref 11.5–14.5)
WBC: 12.1 10*3/uL — ABNORMAL HIGH (ref 3.6–11.0)

## 2016-10-04 LAB — URINALYSIS, COMPLETE (UACMP) WITH MICROSCOPIC
BACTERIA UA: NONE SEEN
Bilirubin Urine: NEGATIVE
Glucose, UA: NEGATIVE mg/dL
Hgb urine dipstick: NEGATIVE
Ketones, ur: NEGATIVE mg/dL
Leukocytes, UA: NEGATIVE
Nitrite: NEGATIVE
PH: 6 (ref 5.0–8.0)
Protein, ur: NEGATIVE mg/dL
RBC / HPF: NONE SEEN RBC/hpf (ref 0–5)
SPECIFIC GRAVITY, URINE: 1.024 (ref 1.005–1.030)

## 2016-10-04 LAB — URINE DRUG SCREEN, QUALITATIVE (ARMC ONLY)
AMPHETAMINES, UR SCREEN: NOT DETECTED
Barbiturates, Ur Screen: NOT DETECTED
Benzodiazepine, Ur Scrn: NOT DETECTED
COCAINE METABOLITE, UR ~~LOC~~: NOT DETECTED
Cannabinoid 50 Ng, Ur ~~LOC~~: POSITIVE — AB
MDMA (ECSTASY) UR SCREEN: NOT DETECTED
METHADONE SCREEN, URINE: NOT DETECTED
Opiate, Ur Screen: NOT DETECTED
Phencyclidine (PCP) Ur S: NOT DETECTED
TRICYCLIC, UR SCREEN: NOT DETECTED

## 2016-10-04 LAB — COMPREHENSIVE METABOLIC PANEL
ALBUMIN: 4.7 g/dL (ref 3.5–5.0)
ALK PHOS: 87 U/L (ref 38–126)
ALT: 24 U/L (ref 14–54)
ANION GAP: 9 (ref 5–15)
AST: 29 U/L (ref 15–41)
BILIRUBIN TOTAL: 0.6 mg/dL (ref 0.3–1.2)
BUN: 10 mg/dL (ref 6–20)
CALCIUM: 9.4 mg/dL (ref 8.9–10.3)
CO2: 26 mmol/L (ref 22–32)
Chloride: 103 mmol/L (ref 101–111)
Creatinine, Ser: 0.77 mg/dL (ref 0.44–1.00)
GFR calc Af Amer: >60 mL/min (ref >60)
GLUCOSE: 112 mg/dL — AB (ref 65–99)
Potassium: 3.3 mmol/L — ABNORMAL LOW (ref 3.5–5.1)
Sodium: 138 mmol/L (ref 135–145)
TOTAL PROTEIN: 8 g/dL (ref 6.5–8.1)

## 2016-10-04 LAB — ETHANOL

## 2016-10-04 LAB — POCT PREGNANCY, URINE: Preg Test, Ur: NEGATIVE

## 2016-10-04 MED ORDER — MIDAZOLAM HCL 2 MG/2ML IJ SOLN
INTRAMUSCULAR | Status: AC
  Filled 2016-10-04: qty 2

## 2016-10-04 MED ORDER — MIDAZOLAM HCL 2 MG/2ML IJ SOLN
2.0000 mg | Freq: Once | INTRAMUSCULAR | Status: AC
  Administered 2016-10-04: 2 mg via INTRAMUSCULAR

## 2016-10-04 MED ORDER — LORAZEPAM 2 MG/ML IJ SOLN: 2.0000 mg | INTRAMUSCULAR | Status: DC | PRN

## 2016-10-04 MED ORDER — ZIPRASIDONE MESYLATE 20 MG IM SOLR
INTRAMUSCULAR | Status: AC
  Administered 2016-10-04: 20 mg via INTRAMUSCULAR
  Filled 2016-10-04: qty 20

## 2016-10-04 MED ORDER — LITHIUM CARBONATE ER 300 MG PO TBCR
600.0000 mg | EXTENDED_RELEASE_TABLET | Freq: Two times a day (BID) | ORAL | Status: DC
  Administered 2016-10-04 – 2016-10-05 (×3): 600 mg via ORAL
  Filled 2016-10-04 (×3): qty 2

## 2016-10-04 MED ORDER — LORAZEPAM 2 MG PO TABS: 2.0000 mg | ORAL_TABLET | ORAL | Status: DC | PRN

## 2016-10-04 MED ORDER — ZIPRASIDONE MESYLATE 20 MG IM SOLR: 20.0000 mg | Freq: Four times a day (QID) | INTRAMUSCULAR | Status: DC | PRN

## 2016-10-04 MED ORDER — ARIPIPRAZOLE ER 400 MG IM SRER
400.0000 mg | Freq: Once | INTRAMUSCULAR | Status: AC
  Administered 2016-10-04: 400 mg via INTRAMUSCULAR
  Filled 2016-10-04: qty 400

## 2016-10-04 MED ORDER — ZIPRASIDONE MESYLATE 20 MG IM SOLR
20.0000 mg | Freq: Once | INTRAMUSCULAR | Status: AC
  Administered 2016-10-04: 20 mg via INTRAMUSCULAR

## 2016-10-04 NOTE — ED Notes (Signed)
Dinner meal given to the patient 

## 2016-10-04 NOTE — ED Notes (Signed)
PT IVC/ PENDING PLACEMENT  

## 2016-10-04 NOTE — ED Notes (Signed)
Attempted to call report to [facility] due to note stating that bed has been assigned - I was told that the pt cannot come [facility]

## 2016-10-04 NOTE — ED Notes (Signed)
Pt. Finished SOC. 

## 2016-10-04 NOTE — ED Provider Notes (Signed)
ED ECG REPORT I, Merrily BrittleNeil Cherye Gaertner, the attending physician, personally viewed and interpreted this ECG.  Date: 10/04/2016 Rate: 94 Rhythm: normal sinus rhythm QRS Axis: normal Intervals: normal ST/T Wave abnormalities: normal Conduction Disturbances: none Narrative Interpretation: unremarkable    Merrily Brittleifenbark, Mirella Gueye, MD 10/04/16 2118

## 2016-10-04 NOTE — ED Notes (Signed)
SOC [facility]  [facility]. Given drink, [facility]

## 2016-10-04 NOTE — ED Notes (Signed)
BEHAVIORAL HEALTH ROUNDING Patient sleeping: Yes.   Patient alert and oriented: eyes closed  Appears to be asleep Behavior appropriate: Yes.  ; If no, describe:  Nutrition and fluids offered: Yes  Toileting and hygiene offered: sleeping Sitter present: q 15 minute observations and security monitoring Law enforcement present: yes  ODS 
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
## 2016-10-04 NOTE — ED Notes (Signed)
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
## 2016-10-04 NOTE — ED Notes (Signed)
BEHAVIORAL HEALTH ROUNDING Patient sleeping: Yes.   Patient alert and oriented: eyes closed  Appears to be  asleep Behavior appropriate: Yes.  ; If no, describe:  Nutrition and fluids offered: Yes  Toileting and hygiene offered: sleeping Sitter present: q 15 minute observations and security  monitoring Law enforcement present: yes  ODS  Easily aroused - VSS  Denies pain

## 2016-10-04 NOTE — ED Notes (Addendum)
[facility] Dr. Toni Amendlapacs.  Attending Physician will be Dr. Jennet MaduroPucilowska.   Patient [facility] room 312 [facility] Gwen.   Intake Paper Work has been signed and placed on patient chart.  ER staff is aware of the admission (  ER Sect.; Dr ER MD; Amy Patient's Nurse & Byrd HesselbachMaria Patient Access).   Intake will inform pts nurse when bed is available  PT WILL BE RE-EVALUATED ON 10/05/16

## 2016-10-04 NOTE — ED Notes (Signed)
Pt. To BHU from ED ambulatory without difficulty, to room  BHU 5. Report from Jeanina RN. Pt. Is alert and oriented, warm and dry in no distress. Pt. Denies SI, HI, and AVH. Pt. Calm and cooperative. Pt. Made aware of security cameras and Q15 minute rounds. Pt. Encouraged to let Nursing staff know of any concerns or needs.  

## 2016-10-04 NOTE — Progress Notes (Signed)
Abilify Maintena given to ED RN Amy at 13:30.

## 2016-10-04 NOTE — ED Notes (Signed)
She can be seen - walking in her room during report  NAD observed
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
## 2016-10-04 NOTE — ED Notes (Signed)
IVC 
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
## 2016-10-04 NOTE — ED Notes (Signed)
ED  Is the patient under IVC or is there intent for IVC: Yes.   Is the patient medically cleared: Yes.   Is there vacancy in the ED BHU: Yes.   Is the population mix appropriate for patient: Yes.   Is the patient awaiting placement in inpatient or outpatient setting:  Has the patient had a psychiatric consult: consult pending   Survey of unit performed for contraband, proper placement and condition of furniture, tampering with fixtures in bathroom, shower, and each patient room: Yes.  - all clear ; Findings:  APPEARANCE/BEHAVIOR Calm and cooperative NEURO ASSESSMENT Orientation: oriented x3  Denies pain Hallucinations: No.None noted (Hallucinations) Speech: Normal Gait: normal RESPIRATORY ASSESSMENT Even  Unlabored respirations  CARDIOVASCULAR ASSESSMENT Pulses equal   regular rate  Skin warm and dry   GASTROINTESTINAL ASSESSMENT no GI complaint EXTREMITIES Full ROM  PLAN OF CARE Provide calm/safe environment. Vital signs assessed twice daily. ED BHU Assessment once each 12-hour shift. Collaborate with TTS daily or as condition indicates. Assure the ED provider has rounded once each shift. Provide and encourage hygiene. Provide redirection as needed. Assess for escalating behavior; address immediately and inform ED provider.  Assess family dynamic and appropriateness for visitation as needed: Yes.  ; If necessary, describe findings:  Educate the patient/family about BHU procedures/visitation: Yes.  ; If necessary, describe findings:
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
## 2016-10-04 NOTE — ED Notes (Signed)
BEHAVIORAL HEALTH ROUNDING Patient sleeping: No. Patient alert and oriented: yes Behavior appropriate: Yes.  ; If no, describe:  Nutrition and fluids offered: yes Toileting and hygiene offered: Yes  Sitter present: q15 minute observations and security  monitoring Law enforcement present: Yes  ODS  

## 2016-10-04 NOTE — ED Notes (Signed)
BEHAVIORAL HEALTH ROUNDING Patient sleeping: No. Patient alert and oriented: yes Behavior appropriate: Yes.  ; If no, describe:  Nutrition and fluids offered: yes Toileting and hygiene offered: Yes  Sitter present: q15 minute observations and security  monitoring Law enforcement present: Yes  ODS  Ambulating in her room - nad assessed  She continues to deny any needs - smiling
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
## 2016-10-04 NOTE — ED Notes (Signed)
I introduced myself to her - she smiled - spoke with her about the plan of care and that she will be waiting to speak with the psychiatrist today after lunch - she verbalized agreement  Informed her that the MD would like a urine sample  - specimen cup provided  She denies pain at this time  No am meds ordered  Continue to monitor

## 2016-10-05 ENCOUNTER — Inpatient Hospital Stay
Admission: EM | Admit: 2016-10-05 | Discharge: 2016-10-11 | DRG: 885 | Disposition: A | Discharge status: Home / Self Care | Payer: Medicaid Other | Attending: Psychiatry | Admitting: Psychiatry

## 2016-10-05 DIAGNOSIS — Z91199 Patient's noncompliance with other medical treatment and regimen due to unspecified reason: Secondary | ICD-10-CM

## 2016-10-05 DIAGNOSIS — Z3202 Encounter for pregnancy test, result negative: Secondary | ICD-10-CM | POA: Diagnosis present

## 2016-10-05 DIAGNOSIS — Z87891 Personal history of nicotine dependence: Secondary | ICD-10-CM | POA: Diagnosis not present

## 2016-10-05 DIAGNOSIS — F419 Anxiety disorder, unspecified: Secondary | ICD-10-CM | POA: Diagnosis present

## 2016-10-05 DIAGNOSIS — F1220 Cannabis dependence, uncomplicated: Secondary | ICD-10-CM | POA: Diagnosis present

## 2016-10-05 DIAGNOSIS — Z79899 Other long term (current) drug therapy: Secondary | ICD-10-CM

## 2016-10-05 DIAGNOSIS — F312 Bipolar disorder, current episode manic severe with psychotic features: Principal | ICD-10-CM | POA: Diagnosis present

## 2016-10-05 DIAGNOSIS — Z9119 Patient's noncompliance with other medical treatment and regimen: Secondary | ICD-10-CM | POA: Diagnosis not present

## 2016-10-05 DIAGNOSIS — I10 Essential (primary) hypertension: Secondary | ICD-10-CM | POA: Diagnosis present

## 2016-10-05 DIAGNOSIS — F4310 Post-traumatic stress disorder, unspecified: Secondary | ICD-10-CM | POA: Diagnosis present

## 2016-10-05 DIAGNOSIS — G4700 Insomnia, unspecified: Secondary | ICD-10-CM | POA: Diagnosis present

## 2016-10-05 DIAGNOSIS — F919 Conduct disorder, unspecified: Secondary | ICD-10-CM | POA: Diagnosis not present

## 2016-10-05 MED ORDER — LITHIUM CARBONATE ER 300 MG PO TBCR
600.0000 mg | EXTENDED_RELEASE_TABLET | Freq: Two times a day (BID) | ORAL | Status: DC
  Administered 2016-10-06 – 2016-10-11 (×11): 600 mg via ORAL
  Filled 2016-10-05 (×12): qty 2

## 2016-10-05 MED ORDER — ZIPRASIDONE MESYLATE 20 MG IM SOLR: 20.0000 mg | Freq: Four times a day (QID) | INTRAMUSCULAR | Status: DC | PRN

## 2016-10-05 MED ORDER — LORAZEPAM 2 MG/ML IJ SOLN: 2.0000 mg | INTRAMUSCULAR | Status: DC | PRN

## 2016-10-05 MED ORDER — ARIPIPRAZOLE 10 MG PO TABS
10.0000 mg | ORAL_TABLET | Freq: Every day | ORAL | Status: DC
  Administered 2016-10-05: 10 mg via ORAL
  Filled 2016-10-05: qty 1

## 2016-10-05 MED ORDER — MAGNESIUM HYDROXIDE 400 MG/5ML PO SUSP: 30.0000 mL | Freq: Every day | ORAL | Status: DC | PRN

## 2016-10-05 MED ORDER — ALUM & MAG HYDROXIDE-SIMETH 200-200-20 MG/5ML PO SUSP: 30.0000 mL | ORAL | Status: DC | PRN

## 2016-10-05 MED ORDER — ARIPIPRAZOLE 10 MG PO TABS: 10.0000 mg | ORAL_TABLET | Freq: Every day | ORAL | Status: DC

## 2016-10-05 MED ORDER — LORAZEPAM 2 MG PO TABS: 2.0000 mg | ORAL_TABLET | ORAL | Status: DC | PRN

## 2016-10-05 MED ORDER — ACETAMINOPHEN 325 MG PO TABS
650.0000 mg | ORAL_TABLET | Freq: Four times a day (QID) | ORAL | Status: DC | PRN
  Administered 2016-10-11: 650 mg via ORAL
  Filled 2016-10-05: qty 2

## 2016-10-05 NOTE — BH Assessment (Signed)
[facility] by Dr. Toni Amendlapacs.  Attending Physician will be Dr. Jennet MaduroPucilowska.   [facility] Gwen.   ER staff is aware of the admission Lewie Loron( Emilie, ER Sect.; Dr. Mayford KnifeWilliams, ER MD; Vikki PortsValerie, Patient's Nurse & Byrd HesselbachMaria, Patient Access).

## 2016-10-05 NOTE — ED Notes (Signed)
Pt's boyfriend brought clothes for pt. Clothing placed in belongings bag and labeled with a sticker.

## 2016-10-05 NOTE — ED Notes (Signed)
Pt has bed in BMU.  Pt to be transferred this afternoon.

## 2016-10-05 NOTE — ED Notes (Signed)
Pt received dinner tray.

## 2016-10-05 NOTE — ED Notes (Signed)
Pt is taking a shower.

## 2016-10-05 NOTE — ED Notes (Signed)
Report was received from Amy B., RN; Pt. Verbalizes no complaints or distress;  Verbalizes having S.I./Hi.; no behavioral problems this PM; has been calm and cooperative. Continue to monitor with 15 min. Monitoring.

## 2016-10-05 NOTE — ED Notes (Signed)
Pt to be transferred next shift.

## 2016-10-05 NOTE — BH Assessment (Signed)
Dr.Clapacs [facility]. Charge RN [facility](Gwen) informed. Bed assignment pending.

## 2016-10-05 NOTE — ED Notes (Signed)
Patient received PM snack. 

## 2016-10-05 NOTE — ED Notes (Signed)
Pt spoke with a female on the phone. Passcode was given by patient. Pt was calm during and atfer call. Phone returned to nurses station.

## 2016-10-05 NOTE — ED Provider Notes (Signed)
-----------------------------------------   7:34 AM on 10/05/2016 -----------------------------------------   Blood pressure (!) 147/87, pulse (!) 101, temperature 99.1 F (37.3 C), temperature source Oral, resp. rate 16, height 5\' 4"  (1.626 m), weight 127 kg (280 lb), SpO2 99 %.  The patient had no acute events since last update.  Calm and cooperative at this time.  Patient to be reevaluated.     Rebecka ApleyWebster, Allison P, MD 10/05/16 [phone]

## 2016-10-05 NOTE — ED Notes (Signed)
ED BHU PLACEMENT JUSTIFICATION Is the patient under IVC or is there intent for IVC: Yes.   Is the patient medically cleared: Yes.   Is there vacancy in the ED BHU: Yes.   Is the population mix appropriate for patient: Yes.   Is the patient awaiting placement in inpatient or outpatient setting: Yes.   Has the patient had a psychiatric consult: Yes.   Survey of unit performed for contraband, proper placement and condition of furniture, tampering with fixtures in bathroom, shower, and each patient room: Yes.  ; Findings: NA APPEARANCE/BEHAVIOR calm, cooperative and adequate rapport can be established NEURO ASSESSMENT Orientation: time, place and person Hallucinations: No.None noted (Hallucinations) Speech: Normal Gait: normal RESPIRATORY ASSESSMENT Normal expansion.  Clear to auscultation.  No rales, rhonchi, or wheezing. CARDIOVASCULAR ASSESSMENT regular rate and rhythm, S1, S2 normal, no murmur, click, rub or gallop GASTROINTESTINAL ASSESSMENT soft, nontender, BS WNL, no r/g EXTREMITIES no deformities PLAN OF CARE Provide calm/safe environment. Vital signs assessed twice daily. ED BHU Assessment once each 12-hour shift. Collaborate with intake RN daily or as condition indicates. Assure the ED provider has rounded once each shift. Provide and encourage hygiene. Provide redirection as needed. Assess for escalating behavior; address immediately and inform ED provider.  Assess family dynamic and appropriateness for visitation as needed: Yes.  ; If necessary, describe findings: NA Educate the patient/family about BHU procedures/visitation: Yes.  ; If necessary, describe findings: NA

## 2016-10-05 NOTE — ED Notes (Addendum)
Pt pacing the perimeter of the unit.  Pt is smiling, but her smile does not seem genuine.  Pt received lunch tray and soft drink. Pt sat down to eat. Maintained on 15 minute checks and observation by security camera for safety.

## 2016-10-05 NOTE — ED Notes (Signed)

## 2016-10-05 NOTE — ED Notes (Signed)
Pt pacing around unit. When RN approached pt she stated, "Im just exercising."  Pt was pleasant, no agitation noted. Pt told she should be moved to BMU this afternoon. Pt accepting. Maintained on 15 minute checks and observation by security camera for safety.

## 2016-10-05 NOTE — ED Notes (Signed)
Pt sitting at the foot of her bed watching a music channel. Pt appears calm. No needs or concerns at this time. Maintained on 15 minute checks and observation by security camera for safety.

## 2016-10-05 NOTE — ED Notes (Signed)
Pt allowed VS to be taken. Pt calm when speaking to Dr. Joseph Artlapcas. Pt informed she will be admitted to BMU. Accepting of this news. Pt standing in room watching music channel. Maintained on 15 minute checks and observation by security camera for safety.

## 2016-10-05 NOTE — ED Notes (Signed)
Pt slowly pacing in room, TV on.  Pt is not agitated. No needs or concerns at this time. Maintained on 15 minute checks and observation by security camera for safety.

## 2016-10-05 NOTE — ED Notes (Signed)

## 2016-10-05 NOTE — ED Notes (Addendum)
Pt smiling, pleasant with RN. Denies SI/HI and AVH. Denies pain. Pt received breakfast tray and soft drink. No needs or concerns at this time. Maintained on 15 minute checks and observation by security camera for safety.

## 2016-10-05 NOTE — ED Notes (Signed)
Pt compliant with medication. Continues to be overly pleasant with staff. Maintained on 15 minute checks and observation by security camera for safety.
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
## 2016-10-06 DIAGNOSIS — F312 Bipolar disorder, current episode manic severe with psychotic features: Principal | ICD-10-CM

## 2016-10-06 LAB — LIPID PANEL
Cholesterol: 191 mg/dL (ref 0–200)
HDL: 32 mg/dL — ABNORMAL LOW (ref >40)
LDL CALC: 131 mg/dL — AB (ref 0–99)
TRIGLYCERIDES: 141 mg/dL (ref <150)
Total CHOL/HDL Ratio: 6 RATIO
VLDL: 28 mg/dL (ref 0–40)

## 2016-10-06 LAB — TSH: TSH: 2.29 u[IU]/mL (ref 0.350–4.500)

## 2016-10-06 MED ORDER — TEMAZEPAM 15 MG PO CAPS: 15.0000 mg | ORAL_CAPSULE | Freq: Every evening | ORAL | Status: DC | PRN

## 2016-10-06 MED ORDER — ZIPRASIDONE HCL 40 MG PO CAPS
40.0000 mg | ORAL_CAPSULE | Freq: Two times a day (BID) | ORAL | Status: AC
  Administered 2016-10-06: 40 mg via ORAL
  Filled 2016-10-06: qty 1

## 2016-10-06 MED ORDER — ZIPRASIDONE HCL 40 MG PO CAPS
60.0000 mg | ORAL_CAPSULE | Freq: Two times a day (BID) | ORAL | Status: DC
  Administered 2016-10-07: 08:00:00 60 mg via ORAL
  Filled 2016-10-06: qty 1

## 2016-10-06 MED ORDER — ZIPRASIDONE HCL 40 MG PO CAPS: 60.0000 mg | ORAL_CAPSULE | Freq: Two times a day (BID) | ORAL | Status: DC

## 2016-10-06 NOTE — [facility] Suicide Risk Assessment (Signed)
[facility] Admission Suicide Risk Assessment   Nursing information obtained from:    Demographic factors:    Current Mental Status:    Loss Factors:    Historical Factors:    Risk Reduction Factors:     Total Time spent with patient: 1 hour Principal Problem: Bipolar disorder, current episode manic, severe with psychotic features [facility]) Diagnosis:   Patient Active Problem List   Diagnosis Date Noted  . Bipolar disorder, current episode manic, severe with psychotic features (HCC) [F31.2] 10/05/2016  . Noncompliance [Z91.19] 10/04/2016  . Benign essential HTN [I10] 10/20/2014  . Cannabis use disorder, severe, dependence (HCC) [F12.20] 10/20/2014   Subjective Data: psychotic break.  Continued Clinical Symptoms:  Alcohol Use Disorder Identification Test Final Score (AUDIT): 1 The "Alcohol Use Disorders Identification Test", Guidelines for Use in Primary Care, Second Edition.  World Science writer [facility]). Score between 0-7:  no or low risk or alcohol related problems. Score between 8-15:  moderate risk of alcohol related problems. Score between 16-19:  high risk of alcohol related problems. Score 20 or above:  warrants further diagnostic evaluation for alcohol dependence and treatment.   CLINICAL FACTORS:   Bipolar Disorder:   Mixed State Alcohol/Substance Abuse/Dependencies   Musculoskeletal: Strength & Muscle Tone: within normal limits Gait & Station: normal Patient leans: N/A  Psychiatric Specialty Exam: Physical Exam  Nursing note and vitals reviewed. Psychiatric: Her speech is normal and behavior is normal. Thought content normal. Cognition and memory are normal. She expresses impulsivity.    Review of Systems  Psychiatric/Behavioral: Positive for substance abuse. The patient has insomnia.   All other systems reviewed and are negative.   Height 5\' 4"  (1.626 m), weight 126.1 kg (278 lb), last menstrual period 09/22/2016.Body mass index is 47.72 kg/m.  General  Appearance: Casual  Eye Contact:  Good  Speech:  Clear and Coherent  Volume:  Normal  Mood:  Euthymic  Affect:  Appropriate  Thought Process:  Goal Directed and Descriptions of Associations: Intact  Orientation:  Full (Time, Place, and Person)  Thought Content:  WDL  Suicidal Thoughts:  No  Homicidal Thoughts:  No  Memory:  Immediate;   Fair Recent;   Fair Remote;   Fair  Judgement:  Poor  Insight:  Shallow  Psychomotor Activity:  Normal  Concentration:  Concentration: Fair and Attention Span: Fair  Recall:  [employer] of Knowledge:  Fair  Language:  Fair  Akathisia:  No  Handed:  Right  AIMS (if indicated):     Assets:  Communication Skills Desire for Improvement Financial Resources/Insurance Housing Physical Health Resilience Social Support  ADL's:  Intact  Cognition:  WNL  Sleep:  Number of Hours: 5      COGNITIVE FEATURES THAT CONTRIBUTE TO RISK:  None    SUICIDE RISK:   Moderate:  Frequent suicidal ideation with limited intensity, and duration, some specificity in terms of plans, no associated intent, good self-control, limited dysphoria/symptomatology, some risk factors present, and identifiable protective factors, including available and accessible social support.  PLAN OF CARE: hospital admission, medication management, substance abuse counseling, discharge planning.  Ms. Cales is a 31 year old female with a history of bipolar illness admitted in a manic, psychotic episode in the context of treatment noncompliance.  1. Agitation. This has resolved.  2. Mood and psychosis. She was restarted on lithium and Abilify for mood stabilization. She prefers Geodon to Abilify. We will switch especially that there are weight concerns.  3. Insomnia. Slept 5 hours.  4.  Anxiety. Hydroxyzine is available.  5. Hypertension. Blood pressure is not elevated.   6. Metabolic syndrome monitoring. Lipid panel, TSH and Hemoglobin A1c were completed during previous  hospitalization.    7. EKG. Pending.  8. Cannabis use. The patient minimizes her problem and declines treatment.  9. Disposition. She will be discharged to home with family. She will follow up with [facility].    I certify that inpatient services furnished can reasonably be expected to improve the patient's condition.   Kristine LineaJolanta Debara Kamphuis, MD 10/06/2016, 6:27 AM
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
## 2016-10-06 NOTE — Progress Notes (Signed)
Pt awoke and attended breakfast this morning, during which she began feeling light headed.  Escorted to room, walked independently.  Vitals taken, see flowsheets.  Encouraged fluids.  Pt reported to med room shortly after this for morning meds and reported that she felt better.  Calm, cooperative and pleasant although vague historian, resistant to discussion with this RN.  Denies SI/HI/AVH.  Later in evening, pt reported that she feels better emotionally than she did earlier in day.  Reviewed meds and education with pt, who agreed to and seemed motivated for treatment.  Observed interacting pleasantly and appropriately with peers.  Denies SI/HI/AVH.   New order for PRN compazine received for nausea - given.  Pt reports better relief of nausea with this than with zofran.
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
## 2016-10-06 NOTE — BHH Counselor (Deleted)
Pt was admitted and discharged within a 24 hour period and as such, a PSA is not needed.  Dorothe PeaJonathan F. Irine Heminger, LCSWA, LCAS  10/06/16

## 2016-10-06 NOTE — BHH Group Notes (Signed)
BHH Group Notes:  (Nursing/MHT/Case Management/Adjunct)  Date:  10/06/2016  Time:  2:30 AM  Type of Therapy:  Evening Wrap-up Group  Participation Level:  Did Not Attend  Participation Quality:  N/A  Affect:  N/A  Cognitive:  N/A  Insight:  None  Engagement in Group:  Did Not Attend  Modes of Intervention:  Discussion  Summary of Progress/Problems:  Jocelyn MorrowChelsea Nanta Elias Bordner 10/06/2016, 2:30 AM
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
## 2016-10-06 NOTE — Plan of Care (Signed)
Problem: Health Behavior/Discharge Planning: Goal: Ability to manage health-related needs will improve Outcome: Not Progressing Pt reported improved mood since this morning and desire for education and compliance with prescribed meds
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
## 2016-10-06 NOTE — Plan of Care (Signed)
Problem: Alteration in Mood Goal: Knowledge of medications will improve Outcome: Progressing Patient was involved with peers this evening.  She was friendly on approach and stated she is still "settleing in".  She asked appropriate questions about her care. She was receptive to attention and support.
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
## 2016-10-07 MED ORDER — LORAZEPAM 2 MG PO TABS
2.0000 mg | ORAL_TABLET | Freq: Four times a day (QID) | ORAL | Status: DC | PRN
  Administered 2016-10-08: 2 mg via ORAL
  Filled 2016-10-07: qty 1

## 2016-10-07 MED ORDER — ZIPRASIDONE HCL 40 MG PO CAPS
80.0000 mg | ORAL_CAPSULE | Freq: Every day | ORAL | Status: DC
  Administered 2016-10-08 – 2016-10-11 (×4): 80 mg via ORAL
  Filled 2016-10-07 (×4): qty 2

## 2016-10-07 MED ORDER — AMLODIPINE BESYLATE 5 MG PO TABS
5.0000 mg | ORAL_TABLET | Freq: Every day | ORAL | Status: DC
  Administered 2016-10-07 – 2016-10-09 (×3): 5 mg via ORAL
  Filled 2016-10-07 (×3): qty 1

## 2016-10-07 NOTE — [facility] Group Notes (Signed)
[facility] Group Notes:  (Nursing/MHT/Case Management/Adjunct)  Date:  10/07/2016  Time:  11:26 PM  Type of Therapy:  Psychoeducational Skills  Participation Level:  Active  Participation Quality:  Appropriate and Sharing  Affect:  Appropriate  Cognitive:  Alert and Appropriate  Insight:  Good  Engagement in Group:  Engaged and Improving  Modes of Intervention:  Discussion  Summary of Progress/Problems:  Jocelyn Martin 10/07/2016, 11:26 PM
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
## 2016-10-07 NOTE — Progress Notes (Signed)
[facility] MD Progress Note  10/07/2016 7:27 AM [name]  MRN:  [id number]  Subjective:   10/07/2016. Jocelyn Martin is rather sleepy from Geodon this morning. We will switch it to a single dose with supper and check lithium level tomorrow morning. She is still somewhat disorganized in her thinking but no longer agitated. She has no complaints. Nurses pointed out that the patient has been "bonding" with a female peer and take care to separate the two.   Per nursing: Problem: Alteration in Mood Goal: Knowledge of medications will improve Outcome: Progressing Patient was involved with peers this evening.  She was friendly on approach and stated she is still "settleing in".  She asked appropriate questions about her care. She was receptive to attention and support.  Principal Problem: Bipolar disorder, current episode manic, severe with psychotic features (HCC) Diagnosis:   Patient Active Problem List   Diagnosis Date Noted  . Bipolar disorder, current episode manic, severe with psychotic features (HCC) [F31.2] 10/05/2016  . Noncompliance [Z91.19] 10/04/2016  . Benign essential HTN [I10] 10/20/2014  . Cannabis use disorder, severe, dependence (HCC) [F12.20] 10/20/2014   Total Time spent with patient: 30 minutes  Past Psychiatric History: bipolar disorder.  Past Medical History:  Past Medical History:  Diagnosis Date  . Bipolar disord, crnt episode mixed, severe, w psych features (HCC)   . PTSD (post-traumatic stress disorder)     Past Surgical History:  Procedure Laterality Date  . ankle Left   . INNER EAR SURGERY     Family History: History reviewed. No pertinent family history. Family Psychiatric  History: bipolar disorder. Social History:  History  Alcohol Use  . Yes    Comment: rare     History  Drug Use  . Types: Marijuana    Comment: 3-4 bowels a day    Social History   Social History  . Marital status: Single    Spouse name: N/A  . Number of children: N/A  . Years of  education: N/A   Social History Main Topics  . Smoking status: Former Games developer  . Smokeless tobacco: Never Used  . Alcohol use Yes     Comment: rare  . Drug use: Yes    Types: Marijuana     Comment: 3-4 bowels a day  . Sexual activity: Not Asked   Other Topics Concern  . None   Social History Narrative  . None   Additional Social History:                         Sleep: Poor  Appetite:  Fair  Current Medications: Current Facility-Administered Medications  Medication Dose Route Frequency Provider Last Rate Last Dose  . acetaminophen (TYLENOL) tablet 650 mg  650 mg Oral Q6H PRN Clapacs, John T, MD      . alum & mag hydroxide-simeth (MAALOX/MYLANTA) 200-200-20 MG/5ML suspension 30 mL  30 mL Oral Q4H PRN Clapacs, John T, MD      . lithium carbonate (LITHOBID) CR tablet 600 mg  600 mg Oral Q12H Clapacs, John T, MD   600 mg at 10/06/16 2128  . LORazepam (ATIVAN) tablet 2 mg  2 mg Oral Q4H PRN Clapacs, John T, MD      . magnesium hydroxide (MILK OF MAGNESIA) suspension 30 mL  30 mL Oral Daily PRN Clapacs, John T, MD      . temazepam (RESTORIL) capsule 15 mg  15 mg Oral QHS PRN Raed Schalk B, MD      .  ziprasidone (GEODON) capsule 60 mg  60 mg Oral BID WC Brenon Antosh B, MD      . ziprasidone (GEODON) injection 20 mg  20 mg Intramuscular Q6H PRN Clapacs, Jackquline Denmark, MD        Lab Results:  Results for orders placed or performed during the hospital encounter of 10/05/16 (from the past 48 hour(s))  Lipid panel     Status: Abnormal   Collection Time: 10/06/16  7:38 AM  Result Value Ref Range   Cholesterol 191 0 - 200 mg/dL   Triglycerides 161 <096 mg/dL   HDL 32 (L) >04 mg/dL   Total CHOL/HDL Ratio 6.0 RATIO   VLDL 28 0 - 40 mg/dL   LDL Cholesterol 540 (H) 0 - 99 mg/dL    Comment:        Total Cholesterol/HDL:CHD Risk Coronary Heart Disease Risk Table                     Men   Women  1/2 Average Risk   3.4   3.3  Average Risk       5.0   4.4  2 X Average  Risk   9.6   7.1  3 X Average Risk  23.4   11.0        Use the calculated Patient Ratio above and the CHD Risk Table to determine the patient's CHD Risk.        ATP III CLASSIFICATION (LDL):  <100     mg/dL   Optimal  981-191  mg/dL   Near or Above                    Optimal  130-159  mg/dL   Borderline  478-295  mg/dL   High  >621     mg/dL   Very High   TSH     Status: None   Collection Time: 10/06/16  7:38 AM  Result Value Ref Range   TSH 2.290 0.350 - 4.500 uIU/mL    Comment: Performed by a 3rd Generation assay with a functional sensitivity of <=0.01 uIU/mL.    Blood Alcohol level:  Lab Results  Component Value Date   ETH <5 10/04/2016   ETH <5 02/13/2016    Metabolic Disorder Labs: Lab Results  Component Value Date   HGBA1C 5.5 02/15/2016   MPG 111 02/15/2016   Lab Results  Component Value Date   PROLACTIN 21.7 02/15/2016   Lab Results  Component Value Date   CHOL 191 10/06/2016   TRIG 141 10/06/2016   HDL 32 (L) 10/06/2016   CHOLHDL 6.0 10/06/2016   VLDL 28 10/06/2016   LDLCALC 131 (H) 10/06/2016   LDLCALC 125 (H) 02/15/2016    Physical Findings: AIMS:  , ,  ,  ,    CIWA:  CIWA-Ar Total: 0 COWS:     Musculoskeletal: Strength & Muscle Tone: within normal limits Gait & Station: normal Patient leans: N/A  Psychiatric Specialty Exam: Physical Exam  Nursing note and vitals reviewed. Psychiatric: She has a normal mood and affect. Her speech is normal and behavior is normal. Thought content normal. Cognition and memory are normal. She expresses impulsivity.    Review of Systems  Psychiatric/Behavioral: Positive for substance abuse.  All other systems reviewed and are negative.   Blood pressure (!) 146/80, pulse 88, temperature 98.7 F (37.1 C), temperature source Oral, resp. rate 18, height 5\' 4"  (1.626 m), weight 126.1 kg (278 lb), last menstrual  period 09/22/2016, SpO2 98 %.Body mass index is 47.72 kg/m.  General Appearance: Casual  Eye  Contact:  Good  Speech:  Clear and Coherent  Volume:  Normal  Mood:  Euthymic  Affect:  Appropriate  Thought Process:  Goal Directed and Descriptions of Associations: Intact  Orientation:  Full (Time, Place, and Person)  Thought Content:  WDL  Suicidal Thoughts:  No  Homicidal Thoughts:  No  Memory:  Immediate;   Fair Recent;   Fair Remote;   Fair  Judgement:  Poor  Insight:  Lacking  Psychomotor Activity:  Normal  Concentration:  Concentration: Fair and Attention Span: Fair  Recall:  [employer]Fair  Fund of Knowledge:  Fair  Language:  Fair  Akathisia:  No  Handed:  Right  AIMS (if indicated):     Assets:  Communication Skills Desire for Improvement Financial Resources/Insurance Housing Physical Health Resilience Social Support  ADL's:  Intact  Cognition:  WNL  Sleep:  Number of Hours: 7.45     Treatment Plan Summary: Daily contact with patient to assess and evaluate symptoms and progress in treatment and Medication management   Ms. Breeden is a 31 year old female with a history of bipolar illness admitted in a manic, psychotic episode in the context of treatment noncompliance.  1. Agitation. This has resolved.  2. Mood and psychosis. She was restarted on lithium and Abilify for mood stabilization. She prefers Geodon. We will switch especially that there are weight concerns. I will give 80 mg of Geodon with supper. She is quite sedated this morning.  3. Insomnia. Slept almost 8.   4. Anxiety. Hydroxyzine is available.  5. Hypertension. She is on Norvasc.    6. Metabolic syndrome monitoring. Lipid panel is normal. TSH and Hemoglobin A1c are normal.      7. EKG. Normal sinus rhythm. QTc 432.    8. Cannabis use. The patient minimizes her problem and declines treatment.  9. Pregnancy test negative.  10. Disposition. She will be discharged to home with family. She will follow up with [facility].  Kristine LineaJolanta Sou Nohr, MD 10/07/2016, 7:27 AM

## 2016-10-07 NOTE — [facility] Group Notes (Signed)
[facility]:  (Clinical Social Work)   10/07/2016    1:00-2:00PM  Summary of Progress/Problems:   The main focus of today's process group was to   1)  discuss importance of adding supports to stay well once out of the hospital  2)  generate ideas about what healthy supports can be added  3)  provide support regarding patient fears about discharge  4)  assess motivation level to make the necessary changes to stay well and out of the hospital  An emphasis was placed on using counselor, doctor, therapy groups, 12-step groups, and problem-specific support groups to expand supports.    The patient stated she has some support, but could really use additional support from her family members, specifically referring to her mother numerous times throughout group.  She stated her mother has depression, refuses to take anti-depressants, and is completely withdrawn from pt.  Mother told her that even if she did not accept phone calls, she would answer text messages; however, she is not doing so.  She has tried a lot of the things suggested by group, but is willing to continue reaching out, sees the benefit of this.  She stated she also feels that her current motivation to change is 8 out of 10.  This will increase, she believes, as she sees success and starts to have more belief in her own ability to stay out of the hospital.  She was very encouraging to some patients who were talking about their low self-esteem, telling them what she appreciates about them.  Type of Therapy:  Process Group with Motivational Interviewing  Participation Level:  Active  Participation Quality:  Attentive, Sharing and Supportive  Affect:  Blunted and Depressed  Cognitive:  Appropriate and Oriented  Insight:  Engaged  Engagement in Therapy:  Engaged  Modes of Intervention:   Education, Support and Processing  Ambrose MantleMareida Grossman-Orr, LCSW 10/07/2016    2:17 PM

## 2016-10-07 NOTE — Plan of Care (Signed)
Problem: Health Behavior/Discharge Planning: Goal: Ability to manage health-related needs will improve Outcome: Progressing Accepts information related to treatment and medications and voices desire to comply with regimen.

## 2016-10-08 LAB — LITHIUM LEVEL: Lithium Lvl: 0.4 mmol/L — ABNORMAL LOW (ref 0.60–1.20)
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
## 2016-10-08 NOTE — Plan of Care (Signed)
Problem: Alteration in Mood Goal: Ability to identify appropriate support needs will improve Outcome: Progressing Patient was pleasant and engaging with writer but superficial.  She was observed spending time talking with several female patients and behavior was appropriate.  She played basketball while outside with the group.  She continues to be guarded about reason for this admission/recent stressors, etc.  Jocelyn Martin was compliant with medications.

## 2016-10-08 NOTE — Tx Team (Signed)
Interdisciplinary Treatment and Diagnostic Plan Update  10/08/2016 Time of Session: 1100 Jocelyn Martin MRN: [id number]  Principal Diagnosis: Bipolar disorder, current episode manic, severe with psychotic features (HCC)  Secondary Diagnoses: Principal Problem:   Bipolar disorder, current episode manic, severe with psychotic features (HCC) Active Problems:   Cannabis use disorder, severe, dependence (HCC)   Noncompliance   Current Medications:  Current Facility-Administered Medications  Medication Dose Route Frequency Provider Last Rate Last Dose  . acetaminophen (TYLENOL) tablet 650 mg  650 mg Oral Q6H PRN Clapacs, John T, MD      . alum & mag hydroxide-simeth (MAALOX/MYLANTA) 200-200-20 MG/5ML suspension 30 mL  30 mL Oral Q4H PRN Clapacs, John T, MD      . amLODipine (NORVASC) tablet 5 mg  5 mg Oral Daily Pucilowska, Jolanta B, MD   5 mg at 10/08/16 0824  . lithium carbonate (LITHOBID) CR tablet 600 mg  600 mg Oral Q12H Clapacs, Jackquline Denmark, MD   600 mg at 10/08/16 0824  . LORazepam (ATIVAN) tablet 2 mg  2 mg Oral Q6H PRN Pucilowska, Jolanta B, MD   2 mg at 10/08/16 0201  . magnesium hydroxide (MILK OF MAGNESIA) suspension 30 mL  30 mL Oral Daily PRN Clapacs, John T, MD      . ziprasidone (GEODON) capsule 80 mg  80 mg Oral Q supper Pucilowska, Jolanta B, MD      . ziprasidone (GEODON) injection 20 mg  20 mg Intramuscular Q6H PRN Clapacs, Jackquline Denmark, MD       PTA Medications: Prescriptions Prior to Admission  Medication Sig Dispense Refill Last Dose  . amLODipine (NORVASC) 10 MG tablet Take 1 tablet (10 mg total) by mouth daily. (Patient not taking: Reported on 10/05/2016) 30 tablet 0 Not Taking at Unknown time  . lithium carbonate (ESKALITH) 450 MG CR tablet Take 1 tablet (450 mg total) by mouth every 12 (twelve) hours. (Patient not taking: Reported on 10/05/2016) 60 tablet 1 Not Taking at Unknown time  . lithium carbonate (LITHOBID) 300 MG CR tablet Take 2 tablets (600 mg total) by mouth every 12  (twelve) hours. (Patient not taking: Reported on 10/05/2016) 60 tablet 1 Not Taking at Unknown time  . loratadine (CLARITIN) 10 MG tablet Take 1 tablet (10 mg total) by mouth daily. (Patient not taking: Reported on 10/05/2016) 30 tablet 0 Not Taking  . temazepam (RESTORIL) 15 MG capsule Take 1 capsule (15 mg total) by mouth at bedtime as needed for sleep. (Patient not taking: Reported on 10/05/2016) 30 capsule 0 Not Taking at Unknown time  . ziprasidone (GEODON) 80 MG capsule Take 1 capsule (80 mg total) by mouth at bedtime. (Patient not taking: Reported on 10/05/2016) 30 capsule 1 Not Taking at Unknown time    Patient Stressors: Marital or family conflict Substance abuse  Patient Strengths: Barrister's clerk for treatment/growth  Treatment Modalities: Medication Management, Group therapy, Case management,  1 to 1 session with clinician, Psychoeducation, Recreational therapy.   Physician Treatment Plan for Primary Diagnosis: Bipolar disorder, current episode manic, severe with psychotic features (HCC) Long Term Goal(s): Improvement in symptoms so as ready for discharge Improvement in symptoms so as ready for discharge   Short Term Goals: Ability to identify changes in lifestyle to reduce recurrence of condition will improve Ability to verbalize feelings will improve Ability to disclose and discuss suicidal ideas Ability to demonstrate self-control will improve Ability to identify and develop effective coping behaviors will improve Compliance with prescribed medications will improve  Ability to identify triggers associated with substance abuse/mental health issues will improve Ability to identify changes in lifestyle to reduce recurrence of condition will improve Ability to demonstrate self-control will improve Ability to identify triggers associated with substance abuse/mental health issues will improve  Medication Management: Evaluate patient's response, side effects, and  tolerance of medication regimen.  Therapeutic Interventions: 1 to 1 sessions, Unit Group sessions and Medication administration.  Evaluation of Outcomes: Progressing  Physician Treatment Plan for Secondary Diagnosis: Principal Problem:   Bipolar disorder, current episode manic, severe with psychotic features (HCC) Active Problems:   Cannabis use disorder, severe, dependence (HCC)   Noncompliance  Long Term Goal(s): Improvement in symptoms so as ready for discharge Improvement in symptoms so as ready for discharge   Short Term Goals: Ability to identify changes in lifestyle to reduce recurrence of condition will improve Ability to verbalize feelings will improve Ability to disclose and discuss suicidal ideas Ability to demonstrate self-control will improve Ability to identify and develop effective coping behaviors will improve Compliance with prescribed medications will improve Ability to identify triggers associated with substance abuse/mental health issues will improve Ability to identify changes in lifestyle to reduce recurrence of condition will improve Ability to demonstrate self-control will improve Ability to identify triggers associated with substance abuse/mental health issues will improve     Medication Management: Evaluate patient's response, side effects, and tolerance of medication regimen.  Therapeutic Interventions: 1 to 1 sessions, Unit Group sessions and Medication administration.  Evaluation of Outcomes: Progressing   RN Treatment Plan for Primary Diagnosis: Bipolar disorder, current episode manic, severe with psychotic features (HCC) Long Term Goal(s): Knowledge of disease and therapeutic regimen to maintain health will improve  Short Term Goals: Ability to verbalize feelings will improve, Ability to disclose and discuss suicidal ideas, Ability to identify and develop effective coping behaviors will improve and Compliance with prescribed medications will  improve  Medication Management: RN will administer medications as ordered by provider, will assess and evaluate patient's response and provide education to patient for prescribed medication. RN will report any adverse and/or side effects to prescribing provider.  Therapeutic Interventions: 1 on 1 counseling sessions, Psychoeducation, Medication administration, Evaluate responses to treatment, Monitor vital signs and CBGs as ordered, Perform/monitor CIWA, COWS, AIMS and Fall Risk screenings as ordered, Perform wound care treatments as ordered.  Evaluation of Outcomes: Progressing   LCSW Treatment Plan for Primary Diagnosis: Bipolar disorder, current episode manic, severe with psychotic features (HCC) Long Term Goal(s): Safe transition to appropriate next level of care at discharge, Engage patient in therapeutic group addressing interpersonal concerns.  Short Term Goals: Engage patient in aftercare planning with referrals and resources, Increase social support and Increase skills for wellness and recovery  Therapeutic Interventions: Assess for all discharge needs, 1 to 1 time with Social worker, Explore available resources and support systems, Assess for adequacy in community support network, Educate family and significant other(s) on suicide prevention, Complete Psychosocial Assessment, Interpersonal group therapy.  Evaluation of Outcomes: Progressing   Progress in Treatment: Attending groups: Yes. Participating in groups: Yes. Taking medication as prescribed: Yes. Toleration medication: Yes. Family/Significant other contact made: No, will contact:  friend Patient understands diagnosis: Yes. Discussing patient identified problems/goals with staff: Yes. Medical problems stabilized or resolved: Yes. Denies suicidal/homicidal ideation: Yes. Issues/concerns per patient self-inventory: No. Other: none  New problem(s) identified: No, Describe:  none  New Short Term/Long Term Goal(s): Pt  goal is to continue my medication and learn more about my mental illness.  Discharge Plan or Barriers: Pt will return to outpt services upon discharge.  Reason for Continuation of Hospitalization: Depression Medication stabilization  Estimated Length of Stay:3-5 days.  Attendees: Patient:Jocelyn Martin 10/08/2016   Physician: Dr. Ardyth Harps, MD 10/08/2016   Nursing: Leonia Reader, RN 10/08/2016   RN Care Manager: 10/08/2016  Social Worker: [name], LCSW 10/08/2016   Recreational Therapist:  10/08/2016   Other: Jake Shark, LCSW 10/08/2016   Other:  10/08/2016   Other: 10/08/2016         Scribe for Treatment Team: Lorri Frederick, LCSW 10/08/2016 11:45 AM

## 2016-10-08 NOTE — [facility] Suicide Risk Assessment (Signed)
[facility] INPATIENT:  Family/Significant Other Suicide Prevention Education  Suicide Prevention Education:  Education Completed; Jocelyn Martin, boyfriend, [phone], has been identified by the patient as the family member/significant other with whom the patient will be residing, and identified as the person(s) who will aid the patient in the event of a mental health crisis (suicidal ideations/suicide attempt).  With written consent from the patient, the family member/significant other has been provided the following suicide prevention education, prior to the and/or following the discharge of the patient.  The suicide prevention education provided includes the following:  Suicide risk factors  Suicide prevention and interventions  National Suicide Hotline telephone number  [facility] assessment telephone number  [facility] Emergency Assistance 911  [facility] and/or Residential Mobile Crisis Unit telephone number  Request made of family/significant other to:  Remove weapons (e.g., guns, rifles, knives), all items previously/currently identified as safety concern.  2 guns have been removed, per Jocelyn Martin.  Remove drugs/medications (over-the-counter, prescriptions, illicit drugs), all items previously/currently identified as a safety concern. He will check the amount of OTC medication they have.  No other meds.  The family member/significant other verbalizes understanding of the suicide prevention education information provided.  The family member/significant other agrees to remove the items of safety concern listed above.  Pt has been hospitalized several times and always stops medication shortly after discharge.  Jocelyn Martin seems to have problems about once a year lately, however it seems to be worse currently.  He has found her having "full blown conversations" with people who are not there on several occasions.  Jocelyn Martin has been talking about some violent things more recently as well  and he has started to have some concerns for his safety and his son in the home's safety as well.  He is going to come visit pt tonight to talk about the future.  Jocelyn Martin, Jocelyn Martin Jon, LCSW 10/08/2016, 2:22 PM

## 2016-10-08 NOTE — Progress Notes (Signed)
Patient denies SI/HI/AVH.  Affect sad.  Smiles easily with engagement.  Visible in the milieu.  Noted walking in hall and laughing to self at one point during the shift.  Medication and group compliant.  Good appetite.  Maintaining personal care chores appropriately.  Support offered.  Safety maintained.

## 2016-10-08 NOTE — Progress Notes (Signed)
Patient was awake at 0200 and asked for medication for anxiety.  She was given Ativan 2 mg and returned to sleep within the hour.

## 2016-10-09 LAB — PROLACTIN: Prolactin: 15.9 ng/mL (ref 4.8–23.3)

## 2016-10-09 LAB — HEMOGLOBIN A1C
Hgb A1c MFr Bld: 5.7 % — ABNORMAL HIGH (ref 4.8–5.6)
MEAN PLASMA GLUCOSE: 117 mg/dL

## 2016-10-09 MED ORDER — AMLODIPINE BESYLATE 5 MG PO TABS
5.0000 mg | ORAL_TABLET | Freq: Once | ORAL | Status: AC
  Administered 2016-10-09: 5 mg via ORAL

## 2016-10-09 MED ORDER — AMLODIPINE BESYLATE 5 MG PO TABS
10.0000 mg | ORAL_TABLET | Freq: Every day | ORAL | Status: DC
  Administered 2016-10-10 – 2016-10-11 (×2): 10 mg via ORAL
  Filled 2016-10-09 (×3): qty 2

## 2016-10-09 NOTE — Progress Notes (Signed)
Pt calm, cooperative and pleasant.  Interacting pleasantly with staff and peers.  Pt is extremely cheerful and friendly, smiling frequently.  Behavior is almost excessively cheerful, telling staff to "Have an awesome day!" and reports she's been trying to cheer one of her peers up.  Reports she feels good and close to being ready for discharge.  Med compliant without encouragement.  Reports depression 1/10, denies anxiety, denies SI/HI/AVH.

## 2016-10-09 NOTE — Progress Notes (Signed)
D: Pt denies SI/HI/AVH. Patient's mood is pleasant and cooperative. Pt  appears less anxious and she is interacting with peers and staff appropriately. Patient's thought are organized no bizarre behavior noted.  A: Pt was offered support and encouragement. Pt was given scheduled medications. Pt was encouraged to attend groups. Q 15 minute checks were done for safety.  R:Pt attends groups and interacts well with peers and staff. Pt is compliant with medication. Pt has no complaints.Pt receptive to treatment and safety maintained on unit.
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
## 2016-10-09 NOTE — [facility] Group Notes (Signed)
[facility] Group Notes:  (Nursing/MHT/Case Management/Adjunct)  Date:  10/09/2016  Time:  1:37 PM  Type of Therapy:  Psychoeducational Skills  Participation Level:  Active  Participation Quality:  Appropriate, Attentive and Sharing  Affect:  Appropriate  Cognitive:  Alert and Appropriate  Insight:  Appropriate  Engagement in Group:  Engaged  Modes of Intervention:  Discussion, Education and Support  Summary of Progress/Problems:  Jocelyn Martin [facility] 10/09/2016, 1:37 PM

## 2016-10-09 NOTE — Progress Notes (Signed)
Recreation Therapy Notes  Date: 06.12.18 Time: 3:00 pm Location: Craft Room  Group Topic: Self-expression  Goal Area(s) Addresses:  Patient will effectively use art as a means of self-expression. Patient will recognize positive benefit of self-expression. Patient will be able to identify one emotion experienced during group session. Patient will identify use of art as a coping skill.  Behavioral Response: Attentive, Interactive  Intervention: Two Faces of Me  Activity: Patients were given a blank face worksheet and were instructed to draw a line down to the middle. On one side of the worksheet, patients were instructed to draw or write how they felt when they were admitted to the hospital and on the other side patients were instructed to draw or write how they want to feel when they are d/c.  Education: LRT educated patients on other forms of self-expression.  Education Outcome: Acknowledges education/In group clarification offered   Clinical Observations/Feedback: Patient drew and wrote how she felt when she was admitted to the hospital and how she wanted to feel when she is d/c. Patient contributed to group discussion by stating what the differences are in the faces, what she has learned since being in the hospital to help with positive change, how she plans to maintain the positive difference, how she can use art as a coping skill, what emotions she felt during group, and what happens when she bottles up her emotions.  [name],[name], LRT/CTRS 10/09/2016 4:02 PM

## 2016-10-09 NOTE — Plan of Care (Signed)
Problem: Health Behavior/Discharge Planning: Goal: Ability to manage health-related needs will improve Outcome: Progressing Compliance with scheduled meds and demonstrated understanding of med regimen

## 2016-10-10 MED ORDER — LITHIUM CARBONATE ER 300 MG PO TBCR: 600.0000 mg | EXTENDED_RELEASE_TABLET | Freq: Two times a day (BID) | ORAL | 0 refills | Status: DC

## 2016-10-10 MED ORDER — ZIPRASIDONE HCL 80 MG PO CAPS: 80.0000 mg | ORAL_CAPSULE | Freq: Every day | ORAL | 0 refills | Status: AC

## 2016-10-10 MED ORDER — AMLODIPINE BESYLATE 10 MG PO TABS: 10.0000 mg | ORAL_TABLET | Freq: Every day | ORAL | 0 refills | Status: DC

## 2016-10-10 NOTE — Progress Notes (Signed)
Recreation Therapy Notes  Date: 06.13.18 Time: 1:00 pm Location: Craft Room  Group Topic: Self-esteem  Goal Area(s) Addresses:  Patient will write at least one positive trait about self. Patient will verbalize benefit of having a healthy self-esteem.  Behavioral Response: Attentive, Interactive  Intervention: I Am  Activity: Patients were given a worksheet with the letter I on it and were instructed to write as many positive traits inside the letter.  Education: LRT educated patients on ways to increase their self-esteem.  Education Outcome: Acknowledges education/In group clarification offered   Clinical Observations/Feedback: Patient wrote positive traits about self. Patient contributed to group discussion by stating what makes it difficult to think of positive traits, how her self-esteem affects her, and how she can increase her self-esteem. Patient had side conversations with peer.  [name],Alixandrea Milleson M, LRT/CTRS 10/10/2016 1:55 PM

## 2016-10-10 NOTE — [facility] Suicide Risk Assessment (Addendum)
[facility]   Principal Problem: Bipolar disorder, current episode manic, severe with psychotic features [facility](HCC) Discharge Diagnoses:  Patient Active Problem List   Diagnosis Date Noted  . Bipolar disorder, current episode manic, severe with psychotic features (HCC) [F31.2] 10/05/2016  . Noncompliance [Z91.19] 10/04/2016  . Benign essential HTN [I10] 10/20/2014  . Cannabis use disorder, severe, dependence (HCC) [F12.20] 10/20/2014     Psychiatric Specialty Exam: ROS  Blood pressure 140/90, pulse 96, temperature 98 F (36.7 C), temperature source Oral, resp. rate 18, height 5\' 4"  (1.626 m), weight 126.1 kg (278 lb), last menstrual period 09/22/2016, SpO2 98 %.Body mass index is 47.72 kg/m.                                                       Mental Status Per Nursing Assessment::   On Admission:     Demographic Factors:  Caucasian  Loss Factors: NA  Historical Factors: Impulsivity  Risk Reduction Factors:   Sense of responsibility to family, Living with another person, especially a relative and Positive social support  No access to guns  Continued Clinical Symptoms:  Previous Psychiatric Diagnoses and Treatments  Cognitive Features That Contribute To Risk:  None    Suicide Risk:  Minimal: No identifiable suicidal ideation.  Patients presenting with no risk factors but with morbid ruminations; may be classified as minimal risk based on the severity of the depressive symptoms  Follow-up Information    Pc, [facility]. Go on 10/12/2016.   Why:  Please attend your hospital discharge appointment on Friday, 10/12/16, at 11:30am.  Please bring a copy of your hospital discharge paperwork. Contact information: [address] [place] [address] [phone]            Jimmy FootmanHernandez-Gonzalez,  Nguyen Todorov, MD 10/10/2016, 1:16 PM

## 2016-10-10 NOTE — [facility] Group Notes (Signed)
[facility] LCSW Group Therapy  10/10/2016 10:31 AM  Type of Therapy:  Group Therapy  Participation Level:  Active  Participation Quality:  Appropriate and Sharing  Affect:  Appropriate  Cognitive:  Alert  Insight:  Developing/Improving  Engagement in Therapy:  Developing/Improving  Modes of Intervention:  Activity, Discussion, Education, Problem-solving, Reality Testing, Socialization and Support  Summary of Progress/Problems: Stress management: Patients defined and discussed the topic of stress and the related symptoms and triggers for stress. Patients identified healthy coping skills they would like to try during hospitalization and after discharge to manage stress in a healthy way. CSW offered insight to varying stress management techniques.   Jocelyn Martin G. Garnette CzechSampson MSW, LCSWA 10/10/2016, 10:31 AM

## 2016-10-10 NOTE — Progress Notes (Signed)
CSW spoke with Jocelyn Martin, pt boyfriend regarding Dr Ardyth HarpsHernandez plan to discharge pt tomorrow.  Discussed that CSW had spoken to Dr Ardyth HarpsHernandez regarding his concerns but that [facility] has seen concerning behaviors for the past several days and pt appears ready for discharge.  Jocelyn Martin is planning to come visit again tonight but will plan on picking pt up on Thursday. Garner NashGregory Delton Stelle, MSW, LCSW Clinical Social Worker 10/10/2016 12:10 PM
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
## 2016-10-10 NOTE — Progress Notes (Signed)
D: Pt denies SI/HI/AVH. Pt is pleasant and cooperative, affect is flat but brightens upon approach.  Pt appears less anxious and she is interacting with peers and staff appropriately. Patient thoughts are organized, no bizarre behavior noted.   A: Pt was offered support and encouragement. Pt was given scheduled medications. Pt was encouraged to attend groups. Q 15 minute checks were done for safety.  R:Pt attends groups and interacts well with peers and staff. Pt is taking medication. Pt has no complaints.Pt receptive to treatment and safety maintained on unit.

## 2016-10-10 NOTE — Progress Notes (Signed)
Pt calm, cooperative and pleasant.  Affect is cheerful, smiling and giggling frequently.  Talking and interacting with staff, peers and students.  Pt is hyperverbal in most conversations.  Reports "I'm all hopped up on the coffee they brought with breakfast."  High energy.  Denies SI/HI/AVH.  Med compliant.

## 2016-10-10 NOTE — Plan of Care (Signed)
Problem: Health Behavior/Discharge Planning: Goal: Ability to manage health-related needs will improve Outcome: Progressing Reports intended compliance with prescribed medication after d/c

## 2016-10-11 LAB — LITHIUM LEVEL
Lithium Lvl: 0.29 mmol/L — ABNORMAL LOW (ref 0.60–1.20)
Lithium Lvl: 0.29 mmol/L — ABNORMAL LOW (ref 0.60–1.20)

## 2016-10-11 MED ORDER — LORAZEPAM 1 MG PO TABS: 1.0000 mg | ORAL_TABLET | Freq: Every evening | ORAL | 0 refills | Status: DC | PRN

## 2016-10-11 NOTE — Progress Notes (Signed)
Nurs Dischg Note:  D:Patient denies SI/HI/AVH at this time. Pt appears calm and cooperative, and no distress noted. Pt stated she felt much clearer. Pt signed all paperwork  A: All Personal items in locker returned to pt. Pt escorted off the unit .  R:  Pt States she will comply with outpatient services, and take MEDS as prescribed.

## 2016-10-11 NOTE — Progress Notes (Signed)
Recreation Therapy Notes  Date: 06.14.18 Time: 1:00 pm Location: Craft Room  Group Topic: Leisure Education  Goal Area(s) Addresses:  Patient will identify activities for each letter of the alphabet. Patient will verbalize ability to integrate leisure into life post d/c. Patient will verbalize ability to use leisure as a Associate Professorcoping skill.  Behavioral Response: Attentive, Interactive  Intervention: Leisure Alphabet  Activity: Patients were given a Leisure Information systems managerAlphabet worksheet and were instructed to write healthy leisure activities for each letter of the alphabet.  Education: LRT educated patients on what they need to participate in leisure.  Education Outcome: Acknowledges education/In group clarification offered   Clinical Observations/Feedback: Patient wrote healthy leisure activities. Patient contributed to group discussion by stating healthy leisure activities, what she needs to participate in leisure, how she can put leisure back into her schedule, and what makes leisure a good coping skill.  [name],Marcayla Budge M, LRT/CTRS 10/11/2016 2:26 PM

## 2016-10-11 NOTE — [facility] Group Notes (Addendum)
[facility] LCSW Group Therapy Note  Date/Time: 10/11/16, 0930  Type of Therapy/Topic:  Group Therapy:  Balance in Life  Participation Level:  active  Description of Group:    This group will address the concept of balance and how it feels and looks when one is unbalanced. Patients will be encouraged to process areas in their lives that are out of balance, and identify reasons for remaining unbalanced. Facilitators will guide patients utilizing problem- solving interventions to address and correct the stressor making their life unbalanced. Understanding and applying boundaries will be explored and addressed for obtaining  and maintaining a balanced life. Patients will be encouraged to explore ways to assertively make their unbalanced needs known to significant others in their lives, using other group members and facilitator for support and feedback.  Therapeutic Goals: 1. Patient will identify two or more emotions or situations they have that consume much of in their lives. 2. Patient will identify signs/triggers that life has become out of balance:  3. Patient will identify two ways to set boundaries in order to achieve balance in their lives:  4. Patient will demonstrate ability to communicate their needs through discussion and/or role plays  Summary of Patient Progress: Pt reports areas of her life that are out of balance are physical, mental/emotional, and family.  Pt planning to take better care of herself and work on resolving some family issues.  Pt very active in the discussion to the point of needing to be redirected a few times.          Therapeutic Modalities:   Cognitive Behavioral Therapy Solution-Focused Therapy Assertiveness Training  Daleen SquibbGreg Reggie Bise, [place]

## 2016-10-11 NOTE — Progress Notes (Signed)
Affect blunted.  Denies SI/HI/AVH.  Verbalizes that she now knows that she has to stay on her medications.   Discharge instructions given, verbalized understanding.

## 2016-10-11 NOTE — [facility] Group Notes (Signed)
Goals Group Date/Time: 10/11/2016 9:00 AM Type of Therapy and Topic: Group Therapy: Goals Group: SMART Goals   Participation Level: Moderate  Description of Group:    The purpose of a daily goals group is to assist and guide patients in setting recovery/wellness-related goals. The objective is to set goals as they relate to the crisis in which they were admitted. Patients will be using SMART goal modalities to set measurable goals. Characteristics of realistic goals will be discussed and patients will be assisted in setting and processing how one will reach their goal. Facilitator will also assist patients in applying interventions and coping skills learned in psycho-education groups to the SMART goal and process how one will achieve defined goal.   Therapeutic Goals:   -Patients will develop and document one goal related to or their crisis in which brought them into treatment.  -Patients will be guided by LCSW using SMART goal setting modality in how to set a measurable, attainable, realistic and time sensitive goal.  -Patients will process barriers in reaching goal.  -Patients will process interventions in how to overcome and successful in reaching goal.   Patient's Goal:Pt goal is to figure out what her schedule for taking her meds will be upon discharge.  Also to restart positive activities at home like writing.   Therapeutic Modalities:  Motivational Interviewing  Research officer, political partyCognitive Behavioral Therapy  Crisis Intervention Model  SMART goals setting   Daleen SquibbGreg Taegan Haider, [place]CS[place]

## 2016-10-11 NOTE — Progress Notes (Signed)
D: Pt denies SI/HI/AVH. Pt is pleasant and cooperative, affect is flat but brightens upon approach.  Pt appears less anxious and she is interacting with peers and staff appropriately. Patient thoughts are organized, no bizarre behavior noted.   A: Pt was offered support and encouragement. Pt was given scheduled medications. Pt was encouraged to attend groups. Q 15 minute checks were done for safety.  R:Pt attends groups and interacts well with peers and staff. Pt is taking medication. Pt has no complaints.Pt receptive to treatment and safety maintained on unit.   

## 2016-10-11 NOTE — Progress Notes (Signed)
  [facility] :  Will you be returning to the same living situation after discharge:  Yes,  with boyfriend At discharge, do you have transportation home?: Yes,  boyfriend Do you have the ability to pay for your medications: Yes,  medicaid  Release of information consent forms completed and in the chart;  Patient's signature needed at discharge.  Patient to Follow up at: Follow-up Information    [facility]. Go on 10/12/2016.   Why:  Please attend your hospital discharge appointment on Friday, 10/12/16, at 11:30am.  Please bring a copy of your hospital discharge paperwork. Contact information: [address] [place] [address] [phone]           Next level of care provider [facility]:no  Safety Planning and Suicide Prevention discussed: Yes,  with boyfriend  Have you used any form of tobacco in the last 30 days? (Cigarettes, Smokeless Tobacco, Cigars, and/or Pipes): No  Has patient been referred to the Quitline?: N/A patient is not a smoker  Patient has been referred for addiction treatment: Yes  Lorri FrederickWierda, Hussien Greenblatt Jon, LCSW 10/11/2016, 11:16 AM

## 2016-10-11 NOTE — Plan of Care (Signed)
Problem: Safety: Goal: Ability to remain free from injury will improve Outcome: Progressing Patient remains free on unit

## 2017-07-09 ENCOUNTER — Other Ambulatory Visit: Payer: Self-pay

## 2017-07-09 ENCOUNTER — Ambulatory Visit
Admission: EM | Admit: 2017-07-09 | Discharge: 2017-07-09 | Disposition: A | Discharge status: Home / Self Care | Payer: Medicaid Other | Attending: Family Medicine | Admitting: Family Medicine

## 2017-07-09 DIAGNOSIS — Z9889 Other specified postprocedural states: Secondary | ICD-10-CM | POA: Diagnosis not present

## 2017-07-09 DIAGNOSIS — F4310 Post-traumatic stress disorder, unspecified: Secondary | ICD-10-CM | POA: Insufficient documentation

## 2017-07-09 DIAGNOSIS — I10 Essential (primary) hypertension: Secondary | ICD-10-CM | POA: Diagnosis not present

## 2017-07-09 DIAGNOSIS — Z87891 Personal history of nicotine dependence: Secondary | ICD-10-CM | POA: Insufficient documentation

## 2017-07-09 DIAGNOSIS — J029 Acute pharyngitis, unspecified: Secondary | ICD-10-CM | POA: Diagnosis not present

## 2017-07-09 DIAGNOSIS — Z79899 Other long term (current) drug therapy: Secondary | ICD-10-CM | POA: Diagnosis not present

## 2017-07-09 DIAGNOSIS — F312 Bipolar disorder, current episode manic severe with psychotic features: Secondary | ICD-10-CM | POA: Diagnosis not present

## 2017-07-09 DIAGNOSIS — H6691 Otitis media, unspecified, right ear: Secondary | ICD-10-CM | POA: Diagnosis not present

## 2017-07-09 DIAGNOSIS — R110 Nausea: Secondary | ICD-10-CM

## 2017-07-09 LAB — RAPID STREP SCREEN (MED CTR MEBANE ONLY): STREPTOCOCCUS, GROUP A SCREEN (DIRECT): NEGATIVE

## 2017-07-09 MED ORDER — MUPIROCIN 2 % EX OINT
1.0000 | TOPICAL_OINTMENT | Freq: Three times a day (TID) | CUTANEOUS | 0 refills | Status: DC
Start: 2017-07-09 — End: 2020-06-15

## 2017-07-09 NOTE — ED Triage Notes (Signed)
Pt states her son was diagnosed with strep yesterday. She also has an infection on her right ear, where she had a piercing in August.

## 2017-07-09 NOTE — Discharge Instructions (Signed)
Take out the piercing for several days.  Cleanse the ear with 50% hydrogen peroxide solution and water.  Dry thoroughly and then apply Bactroban ointment 3 times daily.

## 2017-07-09 NOTE — ED Provider Notes (Signed)
[facility]    CSN: [id number] Arrival date & time: 07/09/17  1906     History   Chief Complaint Chief Complaint  Patient presents with  . Sore Throat    HPI Jocelyn Martin is a 32 y.o. female.   HPI  Patient presents today with itchy throat and nausea.  States that her son was diagnosed with strep yesterday but had a sore throat several days prior to that.  So complaining of an infection that has completely cleared of her right ear since her piercing in August.  Her nurse practitioner prescribed Levaquin for 7 days which she finished but continues to have some drainage.  Is no fever or chills.      Past Medical History:  Diagnosis Date  . Bipolar disord, crnt episode mixed, severe, w psych features (HCC)   . PTSD (post-traumatic stress disorder)     Patient Active Problem List   Diagnosis Date Noted  . Bipolar disorder, current episode manic, severe with psychotic features (HCC) 10/05/2016  . Noncompliance 10/04/2016  . Benign essential HTN 10/20/2014  . Cannabis use disorder, severe, dependence (HCC) 10/20/2014    Past Surgical History:  Procedure Laterality Date  . ankle Left   . INNER EAR SURGERY    . TONSILECTOMY, ADENOIDECTOMY, BILATERAL MYRINGOTOMY AND TUBES      OB History    No data available       Home Medications    Prior to Admission medications   Medication Sig Start Date End Date Taking? Authorizing Provider  levonorgestrel-ethinyl estradiol (VIENVA) 0.1-20 MG-MCG tablet Take 1 tablet by mouth daily.   Yes [provider]  ziprasidone (GEODON) 80 MG capsule Take 1 capsule (80 mg total) by mouth daily with supper. Patient taking differently: Take 40 mg by mouth daily with supper.  10/10/16  Yes Jimmy Footman, MD  amLODipine (NORVASC) 10 MG tablet Take 1 tablet (10 mg total) by mouth daily. 10/11/16   Jimmy Footman, MD  lithium carbonate (LITHOBID) 300 MG CR tablet Take 2 tablets (600 mg total) by  mouth every 12 (twelve) hours. 10/10/16   Jimmy Footman, MD  LORazepam (ATIVAN) 1 MG tablet Take 1 tablet (1 mg total) by mouth at bedtime as needed for anxiety. 10/11/16 10/11/17  Jimmy Footman, MD  mupirocin ointment (BACTROBAN) 2 % Apply 1 application topically 3 (three) times daily. 07/09/17   Lutricia Feil, PA-C    Family History No family history on file.  Social History Social History   Tobacco Use  . Smoking status: Former Games developer  . Smokeless tobacco: Never Used  Substance Use Topics  . Alcohol use: Yes    Comment: rare  . Drug use: Yes    Types: Marijuana    Comment: 3-4 bowls a day     Allergies   Patient has no known allergies.   Review of Systems Review of Systems  Constitutional: Positive for appetite change. Negative for chills, fatigue and fever.  HENT: Positive for congestion, ear discharge, ear pain and sore throat.   Gastrointestinal: Positive for nausea.  Skin: Positive for wound.  All other systems reviewed and are negative.    Physical Exam Triage Vital Signs ED Triage Vitals  Enc Vitals Group     BP 07/09/17 1922 (!) 144/94     Pulse Rate 07/09/17 1922 93     Resp 07/09/17 1922 16     Temp 07/09/17 1922 98 F (36.7 C)     Temp Source 07/09/17 1922  Oral     SpO2 07/09/17 1922 98 %     Weight 07/09/17 1923 256 lb (116.1 kg)     Height 07/09/17 1923 5\' 4"  (1.626 m)     Head Circumference --      Peak Flow --      Pain Score 07/09/17 1922 5     Pain Loc --      Pain Edu? --      Excl. in GC? --    No data found.  Updated Vital Signs BP (!) 144/94 (BP Location: Left Arm)   Pulse 93   Temp 98 F (36.7 C) (Oral)   Resp 16   Ht 5\' 4"  (1.626 m)   Wt 256 lb (116.1 kg)   LMP 07/07/2017   SpO2 98%   BMI 43.94 kg/m   Visual Acuity Right Eye Distance:   Left Eye Distance:   Bilateral Distance:    Right Eye Near:   Left Eye Near:    Bilateral Near:     Physical Exam  Constitutional: She appears  well-developed and well-nourished.  Non-toxic appearance. She does not appear ill. No distress.  HENT:  Head: Normocephalic.  Right Ear: Hearing, tympanic membrane and ear canal normal. There is tenderness.  Left Ear: Hearing, tympanic membrane and ear canal normal.  Mouth/Throat: Oropharynx is clear and moist.  Nursing note and vitals reviewed.    UC Treatments / Results  Labs (all labs ordered are listed, but only abnormal results are displayed) Labs Reviewed  RAPID STREP SCREEN (NOT AT [facility])  CULTURE, GROUP A STREP [facility])    EKG  EKG Interpretation None       Radiology No results found.  Procedures Procedures (including critical care time)  Medications Ordered in UC Medications - No data to display   Initial Impression / Assessment and Plan / UC Course  I have reviewed the triage vital signs and the nursing notes.  Pertinent labs & imaging results that were available during my care of the patient were reviewed by me and considered in my medical decision making (see chart for details).     Plan: 1. Test/x-ray results and diagnosis reviewed with patient 2. rx as per orders; risks, benefits, potential side effects reviewed with patient 3. Recommend supportive treatment with lozenges or salt water gargles currently as necessary to keep your throat sooth.  Cultures and sensitivities from your throat swab will be available in 48 hours.  Your ear I recommend taking the piercing out for several days.  During that time wash the ear with a 50% peroxide water mixture.  Dry thoroughly and apply Bactroban ointment.  If it does not improve return to your nurse practitioner 4. F/u prn if symptoms worsen or don't improve   Final Clinical Impressions(s) / UC Diagnoses   Final diagnoses:  Sore throat  Infection of right ear    ED Discharge Orders        Ordered    mupirocin ointment (BACTROBAN) 2 %  3 times daily     07/09/17 1948       Controlled Substance  Prescriptions [place] Controlled Substance Registry consulted? Not Applicable   Lutricia FeilRoemer, Tarin Navarez P, PA-C 07/09/17 1954

## 2017-07-12 LAB — CULTURE, GROUP A STREP (THRC)

## 2017-09-04 ENCOUNTER — Encounter: Payer: Self-pay | Admitting: Emergency Medicine

## 2017-09-04 ENCOUNTER — Ambulatory Visit
Admission: EM | Admit: 2017-09-04 | Discharge: 2017-09-04 | Disposition: A | Discharge status: Home / Self Care | Payer: Medicaid Other | Attending: Family Medicine | Admitting: Family Medicine

## 2017-09-04 ENCOUNTER — Other Ambulatory Visit: Payer: Self-pay

## 2017-09-04 DIAGNOSIS — R110 Nausea: Secondary | ICD-10-CM

## 2017-09-04 DIAGNOSIS — R42 Dizziness and giddiness: Secondary | ICD-10-CM | POA: Diagnosis not present

## 2017-09-04 DIAGNOSIS — H6690 Otitis media, unspecified, unspecified ear: Secondary | ICD-10-CM

## 2017-09-04 MED ORDER — AMOXICILLIN-POT CLAVULANATE 875-125 MG PO TABS: 1.0000 | ORAL_TABLET | Freq: Two times a day (BID) | ORAL | 0 refills | Status: DC

## 2017-09-04 NOTE — ED Triage Notes (Signed)
Patient states she has had left ear pain for the past 3 days

## 2017-09-04 NOTE — ED Provider Notes (Signed)
[facility]  CSN: [id number] Arrival date & time: 09/04/17  1924  History   Chief Complaint Chief Complaint  Patient presents with  . Otalgia   HPI  32 year old female presents with ear pain.  Patient reports a 2 to 3-day history of left ear pain.  She is also starting to experience right ear pain.  Has a history of recurrent ear infections.  No known inciting factor.  No known exacerbating relieving factors.  She reports associated nausea and dizziness.  No other associated symptoms.  No other complaints.  Past Medical History:  Diagnosis Date  . Bipolar disord, crnt episode mixed, severe, w psych features (HCC)   . PTSD (post-traumatic stress disorder)    Patient Active Problem List   Diagnosis Date Noted  . Bipolar disorder, current episode manic, severe with psychotic features (HCC) 10/05/2016  . Noncompliance 10/04/2016  . Benign essential HTN 10/20/2014  . Cannabis use disorder, severe, dependence (HCC) 10/20/2014    Past Surgical History:  Procedure Laterality Date  . ankle Left   . INNER EAR SURGERY    . TONSILECTOMY, ADENOIDECTOMY, BILATERAL MYRINGOTOMY AND TUBES      OB History   None     Home Medications    Prior to Admission medications   Medication Sig Start Date End Date Taking? Authorizing Provider  ziprasidone (GEODON) 80 MG capsule Take 1 capsule (80 mg total) by mouth daily with supper. Patient taking differently: Take 40 mg by mouth daily with supper.  10/10/16  Yes Jimmy Footman, MD  amoxicillin-clavulanate (AUGMENTIN) 875-125 MG tablet Take 1 tablet by mouth every 12 (twelve) hours. 09/04/17   Tommie Sams, DO  levonorgestrel-ethinyl estradiol (VIENVA) 0.1-20 MG-MCG tablet Take 1 tablet by mouth daily.    [provider]  mupirocin ointment (BACTROBAN) 2 % Apply 1 application topically 3 (three) times daily. 07/09/17   Lutricia Feil, PA-C    Family History Family History  Problem Relation Age of Onset  .  Diabetes Mother   . Diabetes Father     Social History Social History   Tobacco Use  . Smoking status: Former Games developer  . Smokeless tobacco: Never Used  Substance Use Topics  . Alcohol use: Yes    Comment: rare  . Drug use: Yes    Types: Marijuana    Comment: 3-4 bowls a day     Allergies   Patient has no known allergies.   Review of Systems Review of Systems  HENT: Positive for ear pain.   Gastrointestinal: Positive for nausea.  Neurological: Positive for dizziness.   Physical Exam Triage Vital Signs ED Triage Vitals  Enc Vitals Group     BP 09/04/17 1937 127/87     Pulse Rate 09/04/17 1937 89     Resp 09/04/17 1937 16     Temp 09/04/17 1937 98.8 F (37.1 C)     Temp Source 09/04/17 1937 Oral     SpO2 09/04/17 1937 98 %     Weight 09/04/17 1933 250 lb (113.4 kg)     Height 09/04/17 1933  (1.626 m)     Head Circumference --      Peak Flow --      Pain Score 09/04/17 1932 6     Pain Loc --      Pain Edu? --      Excl. in GC? --    Updated Vital Signs BP 127/87 (BP Location: Left Arm)   Pulse 89   Temp  98.8 F (37.1 C) (Oral)   Resp 16   Ht  (1.626 m)   Wt 250 lb (113.4 kg)   LMP 08/07/2017 (Approximate)   SpO2 98%   BMI 42.91 kg/m   Physical Exam  Constitutional: She is oriented to person, place, and time. She appears well-developed. No distress.  HENT:  Head: Normocephalic and atraumatic.  Mouth/Throat: Oropharynx is clear and moist.  Left & Right TM with scarring and erythema.  Dullness.  Cardiovascular: Normal rate and regular rhythm.  Pulmonary/Chest: Effort normal and breath sounds normal. She has no wheezes. She has no rales.  Neurological: She is alert and oriented to person, place, and time.  Psychiatric: She has a normal mood and affect. Her behavior is normal.  Nursing note and vitals reviewed.   UC Treatments / Results  Labs (all labs ordered are listed, but only abnormal results are displayed) Labs Reviewed - No data to  display  EKG None  Radiology No results found.  Procedures Procedures (including critical care time)  Medications Ordered in UC Medications - No data to display  Initial Impression / Assessment and Plan / UC Course  I have reviewed the triage vital signs and the nursing notes.  Pertinent labs & imaging results that were available during my care of the patient were reviewed by me and considered in my medical decision making (see chart for details).    32 year old female presents with otitis media.  Treating with Augmentin.  Final Clinical Impressions(s) / UC Diagnoses   Final diagnoses:  Acute otitis media, unspecified otitis media type     Discharge Instructions     I have placed you on antibiotics.  If you continue to have issues, contact [facility].  No need for drops and oral antibiotic. Oral antibiotic will do the trick.  Take care  Dr. Adriana Simas    ED Prescriptions    Medication Sig Dispense Auth. Provider   amoxicillin-clavulanate (AUGMENTIN) 875-125 MG tablet Take 1 tablet by mouth every 12 (twelve) hours. 14 tablet Tommie Sams, DO     Controlled Substance Prescriptions [place] Controlled Substance Registry consulted? Not Applicable   Tommie Sams, DO 09/04/17 2037

## 2017-09-04 NOTE — Discharge Instructions (Signed)
I have placed you on antibiotics.  If you continue to have issues, contact [facility].  No need for drops and oral antibiotic. Oral antibiotic will do the trick.  Take care  Dr. Adriana Simas

## 2017-10-21 ENCOUNTER — Ambulatory Visit
Admission: EM | Admit: 2017-10-21 | Discharge: 2017-10-21 | Disposition: A | Discharge status: Home / Self Care | Payer: Medicaid Other | Attending: Internal Medicine | Admitting: Internal Medicine

## 2017-10-21 DIAGNOSIS — H60393 Other infective otitis externa, bilateral: Secondary | ICD-10-CM

## 2017-10-21 DIAGNOSIS — H6693 Otitis media, unspecified, bilateral: Secondary | ICD-10-CM

## 2017-10-21 DIAGNOSIS — K0889 Other specified disorders of teeth and supporting structures: Secondary | ICD-10-CM | POA: Diagnosis not present

## 2017-10-21 MED ORDER — FLUCONAZOLE 150 MG PO TABS: 150.0000 mg | ORAL_TABLET | Freq: Once | ORAL | 0 refills | Status: AC

## 2017-10-21 MED ORDER — AMOXICILLIN-POT CLAVULANATE 875-125 MG PO TABS: 1.0000 | ORAL_TABLET | Freq: Two times a day (BID) | ORAL | 0 refills | Status: AC

## 2017-10-21 MED ORDER — NEOMYCIN-POLYMYXIN-HC 3.5-10000-1 OT SUSP: 4.0000 [drp] | OTIC | 0 refills | Status: AC

## 2017-10-21 MED ORDER — ONDANSETRON 8 MG PO TBDP
8.0000 mg | ORAL_TABLET | Freq: Once | ORAL | Status: AC
  Administered 2017-10-21: 8 mg via ORAL

## 2017-10-21 MED ORDER — ONDANSETRON HCL 4 MG PO TABS: 8.0000 mg | ORAL_TABLET | ORAL | 0 refills | Status: DC | PRN

## 2017-10-21 MED ORDER — KETOROLAC TROMETHAMINE 60 MG/2ML IM SOLN
60.0000 mg | Freq: Once | INTRAMUSCULAR | Status: AC
  Administered 2017-10-21: 60 mg via INTRAMUSCULAR

## 2017-10-21 NOTE — Discharge Instructions (Addendum)
Dose of ondansetron (for nausea) and injection of ketorolac (anti inflammatory/pain reliever) were given at the urgent care tonight.  Prescriptions for antibiotic (amoxicillin/clavulanate), ear drops (cortisporin otic), ondansetron (for nausea) and fluconazole (for possible yeast infection) were sent to the pharmacy.  Recheck or followup with your primary care provider for further evaluation if pain/swelling and vomiting are not improving as expected over the next few days.

## 2017-10-21 NOTE — ED Triage Notes (Signed)
Pt here for nausea and vomiting since yesterday. Did state she cannot keep water down and did have her teeth pulled on Friday and states she think it may be a possible infection. Emesis 3x, no otc medication tried besides hydrocodone and ibuprofen.

## 2017-10-21 NOTE — ED Provider Notes (Signed)
[facility]    CSN: [id number] Arrival date & time: 10/21/17  1846     History   Chief Complaint Chief Complaint  Patient presents with  . Emesis    HPI Jocelyn Martin is a 32 y.o. female.   She had multiple dental extractions 3 days ago, and had an increase in pain and swelling in her gums, radiating to the ears, yesterday.  She has had vomiting times several episodes.  She is having throbbing pain in her jaws and in her ears today.  History of ear infections.  No fever.      HPI  Past Medical History:  Diagnosis Date  . Bipolar disord, crnt episode mixed, severe, w psych features (HCC)   . PTSD (post-traumatic stress disorder)     Patient Active Problem List   Diagnosis Date Noted  . Bipolar disorder, current episode manic, severe with psychotic features (HCC) 10/05/2016  . Noncompliance 10/04/2016  . Benign essential HTN 10/20/2014  . Cannabis use disorder, severe, dependence (HCC) 10/20/2014    Past Surgical History:  Procedure Laterality Date  . ankle Left   . INNER EAR SURGERY    . TONSILECTOMY, ADENOIDECTOMY, BILATERAL MYRINGOTOMY AND TUBES       Home Medications    Prior to Admission medications   Medication Sig Start Date End Date Taking? Authorizing Provider  HYDROcodone-acetaminophen (NORCO) 7.5-325 MG tablet Take 1 tablet by mouth every 6 (six) hours as needed for moderate pain (unsure of dose).   Yes [provider]  ibuprofen (ADVIL,MOTRIN) 400 MG tablet Take 400 mg by mouth every 6 (six) hours as needed.   Yes [provider]  levonorgestrel-ethinyl estradiol (VIENVA) 0.1-20 MG-MCG tablet Take 1 tablet by mouth daily.   Yes [provider]  ziprasidone (GEODON) 80 MG capsule Take 1 capsule (80 mg total) by mouth daily with supper. Patient taking differently: Take 40 mg by mouth daily with supper.  10/10/16  Yes Jimmy Footman, MD  amoxicillin-clavulanate (AUGMENTIN) 875-125 MG tablet Take 1 tablet by  mouth every 12 (twelve) hours. 09/04/17   Tommie Sams, DO  amoxicillin-clavulanate (AUGMENTIN) 875-125 MG tablet Take 1 tablet by mouth 2 (two) times daily for 10 days. 10/21/17 10/31/17  Isa Rankin, MD  mupirocin ointment (BACTROBAN) 2 % Apply 1 application topically 3 (three) times daily. 07/09/17   Lutricia Feil, PA-C  neomycin-polymyxin-hydrocortisone (CORTISPORIN) 3.5-10000-1 OTIC suspension Place 4 drops into both ears every 4 (four) hours for 7 days. 10/21/17 10/28/17  Isa Rankin, MD  ondansetron (ZOFRAN) 4 MG tablet Take 2 tablets (8 mg total) by mouth every 4 (four) hours as needed for nausea or vomiting. 10/21/17   Isa Rankin, MD    Family History Family History  Problem Relation Age of Onset  . Diabetes Mother   . Diabetes Father     Social History Social History   Tobacco Use  . Smoking status: Former Games developer  . Smokeless tobacco: Never Used  Substance Use Topics  . Alcohol use: Yes    Comment: rare  . Drug use: Yes    Types: Marijuana    Comment: 3-4 bowls a day     Allergies   Patient has no known allergies.   Review of Systems Review of Systems  All other systems reviewed and are negative.    Physical Exam Triage Vital Signs ED Triage Vitals  Enc Vitals Group     BP 10/21/17 1921 (!) 142/90  Pulse Rate 10/21/17 1921 93     Resp 10/21/17 1921 18     Temp 10/21/17 1921 98.4 F (36.9 C)     Temp Source 10/21/17 1921 Oral     SpO2 10/21/17 1921 98 %     Weight --      Height --      Pain Score 10/21/17 1922 7     Pain Loc --    Updated Vital Signs BP (!) 142/90 (BP Location: Left Arm)   Pulse 93   Temp 98.4 F (36.9 C) (Oral)   Resp 18   LMP  (LMP Unknown)   SpO2 98%  Physical Exam  Constitutional: She is oriented to person, place, and time.  Speech is a little muffled Patient looks very uncomfortable  HENT:  Head: Atraumatic.  Gums are swollen at the distal aspects of the upper and lower jaw, with mild  trismus Lateral ear canals are markedly swollen and red, with some mucousy discharge evident in the left ear.  TMs are dull, and flushed pink Patient relates that she often needs eardrops when she has ear infections, as well as oral agents.  Eyes:  Conjugate gaze observed, no eye redness/discharge  Neck: Neck supple.  Cardiovascular: Normal rate.  Pulmonary/Chest: No respiratory distress.  Abdominal: She exhibits no distension.  Musculoskeletal: Normal range of motion.  Neurological: She is alert and oriented to person, place, and time.  Skin: Skin is warm and dry.  Nursing note and vitals reviewed.    UC Treatments / Results  Labs (all labs ordered are listed, but only abnormal results are displayed) Labs Reviewed - No data to display  EKG None  Radiology No results found.  Procedures Procedures (including critical care time)  Medications Ordered in UC Medications  ondansetron (ZOFRAN-ODT) disintegrating tablet 8 mg (8 mg Oral Given 10/21/17 2011)  ketorolac (TORADOL) injection 60 mg (60 mg Intramuscular Given 10/21/17 2012)   Final Clinical Impressions(s) / UC Diagnoses   Final diagnoses:  Bilateral acute otitis media  Acute infective otitis externa, bilateral  Pain, dental     Discharge Instructions     Dose of ondansetron (for nausea) and injection of ketorolac (anti inflammatory/pain reliever) were given at the urgent care tonight.  Prescriptions for antibiotic (amoxicillin/clavulanate), ear drops (cortisporin otic), ondansetron (for nausea) and fluconazole (for possible yeast infection) were sent to the pharmacy.  Recheck or followup with your primary care provider for further evaluation if pain/swelling and vomiting are not improving as expected over the next few days.    ED Prescriptions    Medication Sig Dispense Auth. Provider   amoxicillin-clavulanate (AUGMENTIN) 875-125 MG tablet Take 1 tablet by mouth 2 (two) times daily for 10 days. 14 tablet Isa RankinMurray,  Nayla Dias Wilson, MD   neomycin-polymyxin-hydrocortisone (CORTISPORIN) 3.5-10000-1 OTIC suspension Place 4 drops into both ears every 4 (four) hours for 7 days. 8.4 mL Isa RankinMurray, Hazyl Marseille Wilson, MD   fluconazole (DIFLUCAN) 150 MG tablet Take 1 tablet (150 mg total) by mouth once for 1 dose. Repeat dose in 3d if needed. For positive yeast test. 2 tablet Isa RankinMurray, Mays Paino Wilson, MD   ondansetron [facility](ZOFRAN) 4 MG tablet Take 2 tablets (8 mg total) by mouth every 4 (four) hours as needed for nausea or vomiting. 20 tablet Isa RankinMurray, Devanshi Califf Wilson, MD        Isa RankinMurray, Yatzary Merriweather Wilson, MD 10/22/17 1400

## 2018-02-06 ENCOUNTER — Other Ambulatory Visit: Payer: Self-pay | Admitting: Physician Assistant

## 2018-02-06 DIAGNOSIS — H9203 Otalgia, bilateral: Secondary | ICD-10-CM

## 2018-02-12 ENCOUNTER — Ambulatory Visit
Admission: RE | Admit: 2018-02-12 | Discharge: 2018-02-12 | Disposition: A | Discharge status: Home / Self Care | Payer: Medicaid Other | Source: Ambulatory Visit | Attending: Physician Assistant | Admitting: Physician Assistant

## 2018-02-17 ENCOUNTER — Other Ambulatory Visit: Payer: Self-pay | Admitting: Physician Assistant

## 2018-02-17 DIAGNOSIS — H9209 Otalgia, unspecified ear: Secondary | ICD-10-CM

## 2018-02-20 ENCOUNTER — Other Ambulatory Visit
Admission: RE | Admit: 2018-02-20 | Discharge: 2018-02-20 | Disposition: A | Discharge status: Home / Self Care | Payer: Medicaid Other | Source: Ambulatory Visit | Attending: Physician Assistant | Admitting: Physician Assistant

## 2018-02-20 ENCOUNTER — Ambulatory Visit
Admission: RE | Admit: 2018-02-20 | Discharge: 2018-02-20 | Disposition: A | Discharge status: Home / Self Care | Payer: Medicaid Other | Source: Ambulatory Visit | Attending: Physician Assistant | Admitting: Physician Assistant

## 2018-02-20 ENCOUNTER — Encounter (INDEPENDENT_AMBULATORY_CARE_PROVIDER_SITE_OTHER): Payer: Self-pay

## 2018-02-20 DIAGNOSIS — H9209 Otalgia, unspecified ear: Secondary | ICD-10-CM | POA: Diagnosis present

## 2018-02-20 HISTORY — DX: Essential (primary) hypertension: I10

## 2018-02-20 LAB — CREATININE, SERUM
Creatinine, Ser: 0.86 mg/dL (ref 0.44–1.00)
GFR calc non Af Amer: >60 mL/min (ref >60)

## 2018-02-20 MED ORDER — IOHEXOL 300 MG/ML  SOLN
75.0000 mL | Freq: Once | INTRAMUSCULAR | Status: AC | PRN
  Administered 2018-02-20: 75 mL via INTRAVENOUS

## 2019-05-18 IMAGING — CT CT NECK W/ CM
2 of 3 series · 8 of 14 positions shown, 10 images · IV contrast (omnipaque)
Comparison: Head CT without contrast 10/20/2014.

CLINICAL DATA: 32-year-old female with chronic bilateral ear
infections, 3 so far this year. Otalgia.

EXAM:
CT NECK WITH CONTRAST
TECHNIQUE: Multidetector CT imaging of the neck was performed using the
standard protocol following the bolus administration of intravenous
contrast.
CONTRAST:  75mL OMNIPAQUE IOHEXOL 300 MG/ML  SOLN

[Series 3: axial neck · axial · 0.56mm/px · z∈[-529,-417]mm · 3 of 112 slices shown]
[im 28/112  bone]
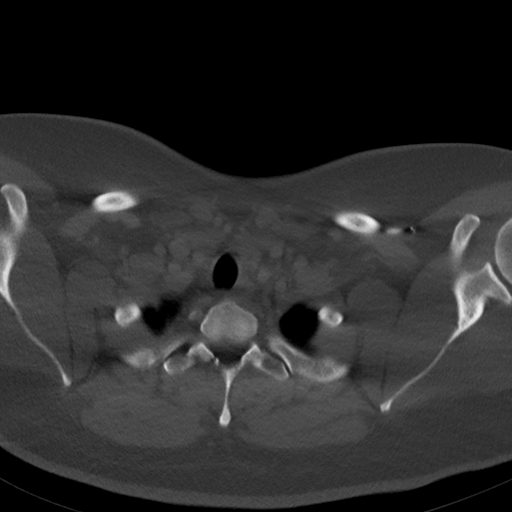
[im 56/112  bone]
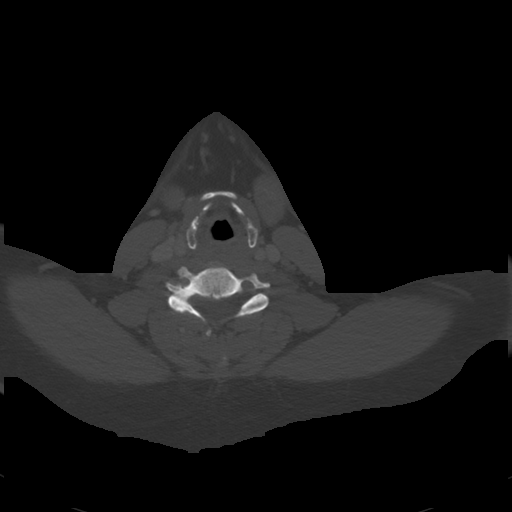
[im 84/112  bone]
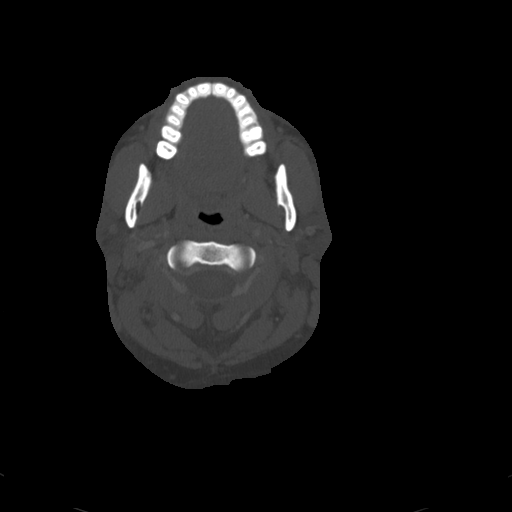

[Series 8: orthogonal ax · axial · 0.51mm/px · z∈[-593,-405]mm · 5 of 144 slices shown, 7 images]
[im 24/144  soft-tissue]
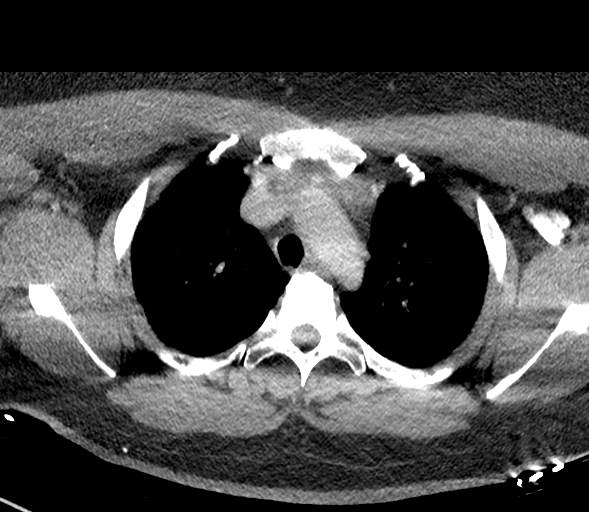
[im 24/144  bone]
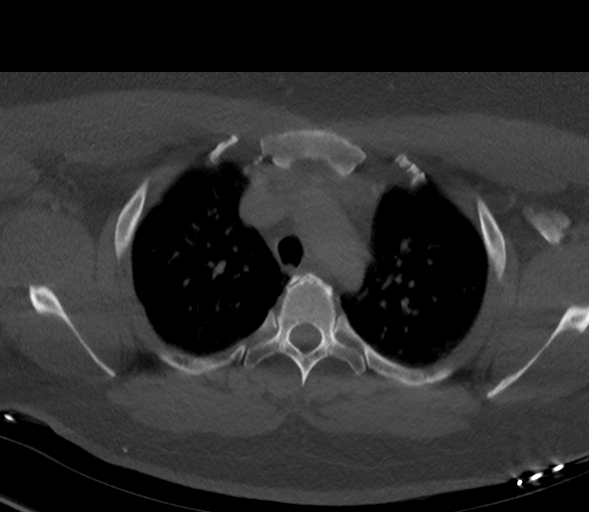
[im 48/144  bone]
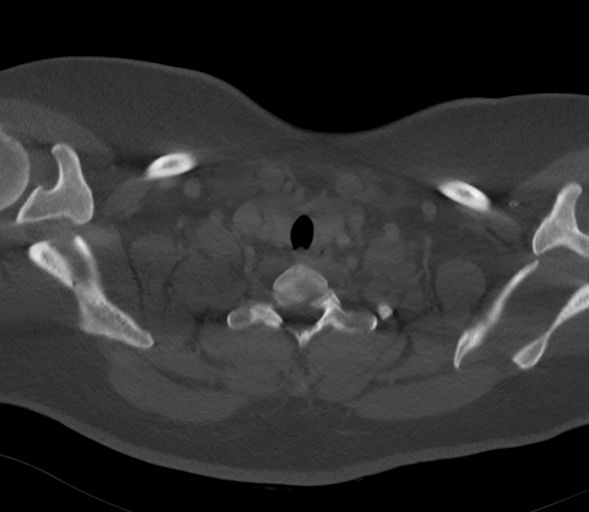
[im 72/144  bone]
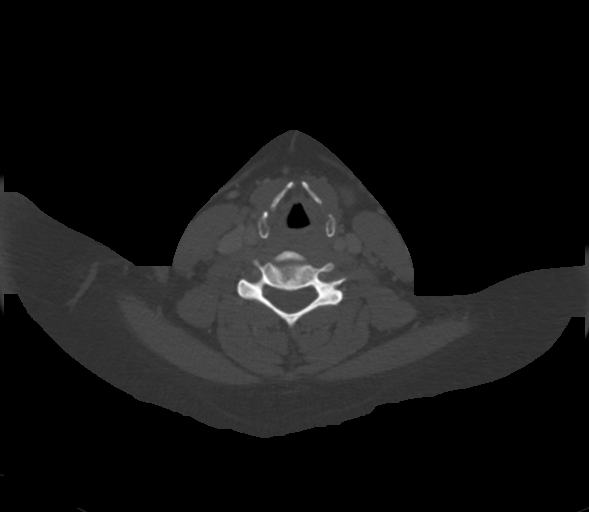
[im 96/144  bone]
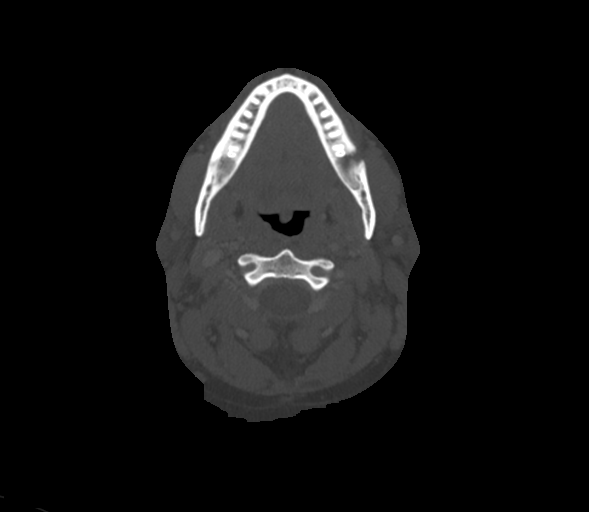
[im 120/144  soft-tissue]
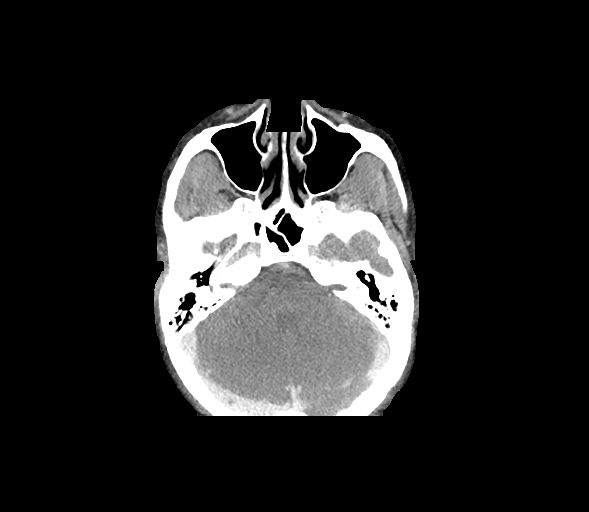
[im 120/144  bone]
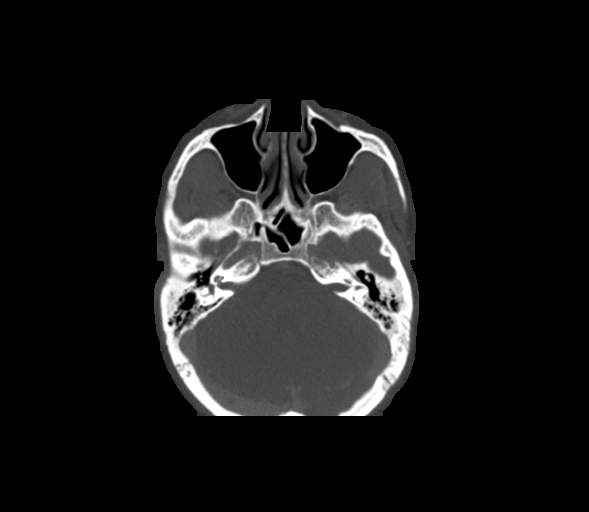

[8 of 14 positions shown; findings below may reference images not displayed]

FINDINGS: Pharynx and larynx: Laryngeal and pharyngeal soft tissue contours
are normal. Negative parapharyngeal and retropharyngeal spaces.

Salivary glands: Negative sublingual space. Submandibular glands and
parotid glands are within normal limits.

Thyroid: Negative.

Lymph nodes: Cervical lymph nodes are symmetric and within normal
limits. No abnormally enlarged, cystic, or necrotic nodes.

Vascular: Major vascular structures in the neck and at the skull
base are patent.

Limited intracranial: Negative.

Visualized orbits: Negative.

Mastoids and visualized paranasal sinuses: The bilateral tympanic
cavities and mastoid air cells appear clear. No mastoid sclerosis or
coalescence identified. The bilateral external auditory canals
appear clear. Grossly normal bilateral ossicles and tympanic
membranes.

Visible paranasal sinuses are clear.

Skeleton: Congenital incomplete ossification of the posterior C1
ring, normal variant. Mild residual bilateral mandible wisdom tooth
extraction sites. No acute dental finding. No other osseous
abnormality.

Upper chest: Negative visible superior mediastinum. Normal visible
lung apices. Visible axillary lymph nodes appear normal.
IMPRESSION: 1. Negative Neck CT.
2. The bilateral middle ears and mastoids appear pneumatized and
unremarkable.

## 2020-05-11 ENCOUNTER — Encounter: Payer: Self-pay | Admitting: Emergency Medicine

## 2020-05-11 ENCOUNTER — Ambulatory Visit
Admission: EM | Admit: 2020-05-11 | Discharge: 2020-05-11 | Disposition: A | Discharge status: Home / Self Care | Payer: Medicaid Other | Attending: Family Medicine | Admitting: Family Medicine

## 2020-05-11 ENCOUNTER — Other Ambulatory Visit: Payer: Self-pay

## 2020-05-11 DIAGNOSIS — H6506 Acute serous otitis media, recurrent, bilateral: Secondary | ICD-10-CM | POA: Diagnosis not present

## 2020-05-11 MED ORDER — AMOXICILLIN-POT CLAVULANATE 875-125 MG PO TABS: 1.0000 | ORAL_TABLET | Freq: Two times a day (BID) | ORAL | 0 refills | Status: DC

## 2020-05-11 NOTE — ED Triage Notes (Signed)
Pt states that she has double ear pain that started a couple weeks ago. Pt states that she is prone to ear infection.

## 2020-05-11 NOTE — ED Provider Notes (Signed)
[facility]    CSN: [id number] Arrival date & time: 05/11/20  1648      History   Chief Complaint Chief Complaint  Patient presents with  . Otalgia   HPI  35 year old female presents with bilateral ear pain.  Patient states that started approximately 3 weeks ago.  Occurred after she got water in her ear during shower.  Patient states that she has a history of recurrent ear infections.  Has previously seen ENT.  Patient states that she believes that she has a bilateral ear infection.  She has no other other reported symptoms.  Pain 3/10 in severity.  No other complaints.  Past Medical History:  Diagnosis Date  . Bipolar disord, crnt episode mixed, severe, w psych features (HCC)   . Hypertension   . PTSD (post-traumatic stress disorder)     Patient Active Problem List   Diagnosis Date Noted  . Bipolar disorder, current episode manic, severe with psychotic features (HCC) 10/05/2016  . Noncompliance 10/04/2016  . Benign essential HTN 10/20/2014  . Cannabis use disorder, severe, dependence (HCC) 10/20/2014    Past Surgical History:  Procedure Laterality Date  . ankle Left   . INNER EAR SURGERY    . TONSILECTOMY, ADENOIDECTOMY, BILATERAL MYRINGOTOMY AND TUBES      OB History   No obstetric history on file.      Home Medications    Prior to Admission medications   Medication Sig Start Date End Date Taking? Authorizing Provider  amoxicillin-clavulanate (AUGMENTIN) 875-125 MG tablet Take 1 tablet by mouth 2 (two) times daily. 05/11/20  Yes Kordelia Severin G, DO  clonazePAM (KLONOPIN) 0.5 MG tablet Take 0.5 mg by mouth at bedtime. 05/02/20  Yes [provider]  levonorgestrel-ethinyl estradiol (ALESSE) 0.1-20 MG-MCG tablet Take 1 tablet by mouth daily.   Yes [provider]  lithium carbonate (ESKALITH) 450 MG CR tablet Take by mouth. 11/26/18  Yes [provider]  ziprasidone (GEODON) 80 MG capsule Take 1 capsule (80 mg total) by mouth  daily with supper. Patient taking differently: Take 40 mg by mouth daily with supper. 10/10/16  Yes Jimmy Footman, MD  HYDROcodone-acetaminophen (NORCO) 7.5-325 MG tablet Take 1 tablet by mouth every 6 (six) hours as needed for moderate pain (unsure of dose).    [provider]  ibuprofen (ADVIL,MOTRIN) 400 MG tablet Take 400 mg by mouth every 6 (six) hours as needed.    [provider]  mupirocin ointment (BACTROBAN) 2 % Apply 1 application topically 3 (three) times daily. 07/09/17   Lutricia Feil, PA-C    Family History Family History  Problem Relation Age of Onset  . Diabetes Mother   . Diabetes Father     Social History Social History   Tobacco Use  . Smoking status: Former Games developer  . Smokeless tobacco: Never Used  Vaping Use  . Vaping Use: Never used  Substance Use Topics  . Alcohol use: Yes    Comment: rare  . Drug use: Yes    Types: Marijuana    Comment: 3-4 bowls a day     Allergies   Patient has no known allergies.   Review of Systems Review of Systems  Constitutional: Negative for fever.  HENT: Positive for ear pain.    Physical Exam Triage Vital Signs ED Triage Vitals  Enc Vitals Group     BP 05/11/20 1731 (!) 134/98     Pulse Rate 05/11/20 1731 88     Resp 05/11/20 1731 16  Temp 05/11/20 1731 98.9 F (37.2 C)     Temp Source 05/11/20 1731 Oral     SpO2 05/11/20 1731 99 %     Weight --      Height --      Head Circumference --      Peak Flow --      Pain Score 05/11/20 1726 3     Pain Loc --      Pain Edu? --      Excl. in GC? --    Updated Vital Signs BP (!) 134/98 (BP Location: Left Arm)   Pulse 88   Temp 98.9 F (37.2 C) (Oral)   Resp 16   LMP 04/11/2020 (Approximate)   SpO2 99%   Visual Acuity Right Eye Distance:   Left Eye Distance:   Bilateral Distance:    Right Eye Near:   Left Eye Near:    Bilateral Near:     Physical Exam Constitutional:      General: She is not in acute distress.     Appearance: Normal appearance. She is obese. She is not ill-appearing.  HENT:     Head: Normocephalic and atraumatic.     Ears:     Comments: Left TM with scarring, erythema, lack of normal landmarks.  Right TM with scarring.  Mild effusion. Neurological:     Mental Status: She is alert.  Psychiatric:     Comments: Flat affect.  Depressed mood.    UC Treatments / Results  Labs (all labs ordered are listed, but only abnormal results are displayed) Labs Reviewed - No data to display  EKG   Radiology No results found.  Procedures Procedures (including critical care time)  Medications Ordered in UC Medications - No data to display  Initial Impression / Assessment and Plan / UC Course  I have reviewed the triage vital signs and the nursing notes.  Pertinent labs & imaging results that were available during my care of the patient were reviewed by me and considered in my medical decision making (see chart for details).    35 year old female presents with recurrent serous otitis media.  Treating with Augmentin.  Final Clinical Impressions(s) / UC Diagnoses   Final diagnoses:  Recurrent acute serous otitis media of both ears   Discharge Instructions   None    ED Prescriptions    Medication Sig Dispense Auth. Provider   amoxicillin-clavulanate (AUGMENTIN) 875-125 MG tablet Take 1 tablet by mouth 2 (two) times daily. 20 tablet Tommie Sams, DO     PDMP not reviewed this encounter.   Tommie Sams, [place] 05/11/20 2009

## 2020-06-15 ENCOUNTER — Ambulatory Visit
Admission: EM | Admit: 2020-06-15 | Discharge: 2020-06-15 | Disposition: A | Discharge status: Home / Self Care | Payer: Medicaid Other | Attending: Internal Medicine | Admitting: Internal Medicine

## 2020-06-15 ENCOUNTER — Other Ambulatory Visit: Payer: Self-pay

## 2020-06-15 DIAGNOSIS — H6504 Acute serous otitis media, recurrent, right ear: Secondary | ICD-10-CM

## 2020-06-15 MED ORDER — CIPROFLOXACIN-DEXAMETHASONE 0.3-0.1 % OT SUSP: 4.0000 [drp] | Freq: Two times a day (BID) | OTIC | 0 refills | Status: DC

## 2020-06-15 MED ORDER — AMOXICILLIN 875 MG PO TABS: 875.0000 mg | ORAL_TABLET | Freq: Two times a day (BID) | ORAL | 0 refills | Status: DC

## 2020-06-15 NOTE — ED Provider Notes (Signed)
[facility]    CSN: [id number] Arrival date & time: 06/15/20  1659      History   Chief Complaint Chief Complaint  Patient presents with  . Ear Pain    HPI Jocelyn Martin is a 35 y.o. female who presents with bilateral ear pain and amber drainage from them R>L x 1 week.  Has ran a low grade temp of 99.9. Denies URI.     Past Medical History:  Diagnosis Date  . Bipolar disord, crnt episode mixed, severe, w psych features (HCC)   . Hypertension   . PTSD (post-traumatic stress disorder)     Patient Active Problem List   Diagnosis Date Noted  . Bipolar disorder, current episode manic, severe with psychotic features (HCC) 10/05/2016  . Noncompliance 10/04/2016  . Benign essential HTN 10/20/2014  . Cannabis use disorder, severe, dependence (HCC) 10/20/2014    Past Surgical History:  Procedure Laterality Date  . ankle Left   . INNER EAR SURGERY    . TONSILECTOMY, ADENOIDECTOMY, BILATERAL MYRINGOTOMY AND TUBES      OB History   No obstetric history on file.      Home Medications    Prior to Admission medications   Medication Sig Start Date End Date Taking? Authorizing Provider  amoxicillin (AMOXIL) 875 MG tablet Take 1 tablet (875 mg total) by mouth 2 (two) times daily. 06/15/20  Yes Rodriguez-Southworth, Nettie Elm, PA-C  ciprofloxacin-dexamethasone (CIPRODEX) OTIC suspension Place 4 drops into both ears 2 (two) times daily. 06/15/20  Yes Rodriguez-Southworth, Nettie Elm, PA-C  clonazePAM (KLONOPIN) 0.5 MG tablet Take 0.5 mg by mouth at bedtime. 05/02/20  Yes [provider]  levonorgestrel-ethinyl estradiol (ALESSE) 0.1-20 MG-MCG tablet Take 1 tablet by mouth daily.   Yes [provider]  lithium carbonate (ESKALITH) 450 MG CR tablet Take by mouth. 11/26/18  Yes [provider]  ziprasidone (GEODON) 80 MG capsule Take 1 capsule (80 mg total) by mouth daily with supper. Patient taking differently: Take 40 mg by mouth daily with supper.  10/10/16  Yes Jimmy Footman, MD    Family History Family History  Problem Relation Age of Onset  . Diabetes Mother   . Diabetes Father     Social History Social History   Tobacco Use  . Smoking status: Former Games developer  . Smokeless tobacco: Never Used  Vaping Use  . Vaping Use: Never used  Substance Use Topics  . Alcohol use: Yes    Comment: rare  . Drug use: Yes    Types: Marijuana    Comment: 3-4 bowls a day     Allergies   Patient has no known allergies.   Review of Systems Review of Systems  Constitutional: Positive for fever. Negative for chills and diaphoresis.  HENT: Positive for ear discharge and ear pain. Negative for congestion and rhinorrhea.   Respiratory: Negative for cough.   Musculoskeletal: Negative for myalgias.  Hematological: Negative for adenopathy.     Physical Exam Triage Vital Signs ED Triage Vitals [06/15/20 1716]  Enc Vitals Group     BP (!) 147/99     Pulse Rate 89     Resp 18     Temp 98.6 F (37 C)     Temp Source Oral     SpO2 100 %     Weight 272 lb (123.4 kg)     Height 5\' 4"  (1.626 m)     Head Circumference      Peak Flow  Pain Score 5     Pain Loc      Pain Edu?      Excl. in GC?    No data found.  Updated Vital Signs BP (!) 147/99 (BP Location: Left Arm)   Pulse 89   Temp 98.6 F (37 C) (Oral)   Resp 18   Ht 5\' 4"  (1.626 m)   Wt 272 lb (123.4 kg)   LMP 06/14/2020   SpO2 100%   BMI 46.69 kg/m   Visual Acuity Right Eye Distance:   Left Eye Distance:   Bilateral Distance:    Right Eye Near:   Left Eye Near:    Bilateral Near:     Physical Exam Vitals and nursing note reviewed.  Constitutional:      General: She is not in acute distress.    Appearance: She is obese. She is not toxic-appearing.  HENT:     Head: Normocephalic.     Right Ear: Ear canal and external ear normal.     Left Ear: Ear canal and external ear normal.     Ears:     Comments: Both TM's with scarring and are  dull and gray. No active drainage noted.  Eyes:     Conjunctiva/sclera: Conjunctivae normal.  Pulmonary:     Effort: Pulmonary effort is normal.  Musculoskeletal:        General: Normal range of motion.     Cervical back: Neck supple.  Skin:    General: Skin is warm and dry.     Findings: No rash.  Neurological:     Mental Status: She is alert and oriented to person, place, and time.     Gait: Gait normal.  Psychiatric:        Mood and Affect: Mood normal.        Behavior: Behavior normal.        Thought Content: Thought content normal.        Judgment: Judgment normal.      UC Treatments / Results  Labs (all labs ordered are listed, but only abnormal results are displayed) Labs Reviewed - No data to display  EKG   Radiology No results found.  Procedures Procedures (including critical care time)  Medications Ordered in UC Medications - No data to display  Initial Impression / Assessment and Plan / UC Course  I have reviewed the triage vital signs and the nursing notes. Early OM with possible effusion. I placed her on Amoxicillin and Ciprodex which she states that is the preference from her ENT. Needs to Fu with ENT in 10 days  Final Clinical Impressions(s) / UC Diagnoses   Final diagnoses:  Recurrent acute serous otitis media of right ear     Discharge Instructions     Follow up with ENT in 10 days   ED Prescriptions    Medication Sig Dispense Auth. Provider   ciprofloxacin-dexamethasone (CIPRODEX) OTIC suspension Place 4 drops into both ears 2 (two) times daily. 7.5 mL Rodriguez-Southworth, 06/16/2020, PA-C   amoxicillin (AMOXIL) 875 MG tablet Take 1 tablet (875 mg total) by mouth 2 (two) times daily. 14 tablet Rodriguez-Southworth, Nettie Elm, PA-C     PDMP not reviewed this encounter.   Nettie Elm, Garey Ham 06/15/20 1809

## 2020-06-15 NOTE — Discharge Instructions (Addendum)
Follow up with ENT in 10 days

## 2020-06-15 NOTE — ED Triage Notes (Signed)
Patient states that she has been having bilateral ear pain since the beginning of January. Was seen here on 1/12 and given Augmentin. States that she has started to notice more pain and drainage with headaches.

## 2020-12-12 ENCOUNTER — Other Ambulatory Visit: Payer: Self-pay

## 2020-12-12 ENCOUNTER — Ambulatory Visit
Admission: EM | Admit: 2020-12-12 | Discharge: 2020-12-12 | Disposition: A | Discharge status: Home / Self Care | Payer: Medicaid Other | Attending: Physician Assistant | Admitting: Physician Assistant

## 2020-12-12 DIAGNOSIS — H66006 Acute suppurative otitis media without spontaneous rupture of ear drum, recurrent, bilateral: Secondary | ICD-10-CM | POA: Diagnosis not present

## 2020-12-12 MED ORDER — AMOXICILLIN 500 MG PO CAPS
500.0000 mg | ORAL_CAPSULE | Freq: Two times a day (BID) | ORAL | 0 refills | Status: AC
Start: 2020-12-12 — End: 2020-12-22

## 2020-12-12 MED ORDER — NEOMYCIN-POLYMYXIN-HC 3.5-10000-1 OT SUSP: 4.0000 [drp] | Freq: Three times a day (TID) | OTIC | 0 refills | Status: AC

## 2020-12-12 MED ORDER — CIPROFLOXACIN-DEXAMETHASONE 0.3-0.1 % OT SUSP: 4.0000 [drp] | Freq: Two times a day (BID) | OTIC | 0 refills | Status: AC

## 2020-12-12 NOTE — ED Provider Notes (Addendum)
[facility]    CSN: [id number] Arrival date & time: 12/12/20  1630      History   Chief Complaint Chief Complaint  Patient presents with   Otalgia    HPI Jocelyn Martin is a 35 y.o. female.   Patient is a 35 year old female who presents with chief complaint of bilateral ear infections.  Patient states she has recurrent ear infections and has to wear earplugs with showering due to her frequent infections.  Patient reports that her symptoms been going ongoing for about a month but worse last couple days.  Patient states she will wake up in the morning with her ear is clogged.  Patient also reports history of seasonal allergies that she takes a daily allergy medication as well as a daily nasal spray.  Patient reports nasal congestion, runny nose, and a headache in addition to her ear pain.  She has taken Tylenol for the pain.  She reports subjective fever yesterday as well as chills.  Patient denies any allergies.  Patient is followed by a ENT has told her she may require ear tubes should she continue to have frequent infections.   Past Medical History:  Diagnosis Date   Bipolar disord, crnt episode mixed, severe, w psych features (HCC)    Hypertension    PTSD (post-traumatic stress disorder)     Patient Active Problem List   Diagnosis Date Noted   Bipolar disorder, current episode manic, severe with psychotic features (HCC) 10/05/2016   Noncompliance 10/04/2016   Benign essential HTN 10/20/2014   Cannabis use disorder, severe, dependence (HCC) 10/20/2014    Past Surgical History:  Procedure Laterality Date   ankle Left    INNER EAR SURGERY     TONSILECTOMY, ADENOIDECTOMY, BILATERAL MYRINGOTOMY AND TUBES      OB History   No obstetric history on file.      Home Medications    Prior to Admission medications   Medication Sig Start Date End Date Taking? Authorizing Provider  amoxicillin (AMOXIL) 500 MG capsule Take 1 capsule (500 mg total) by mouth 2 (two)  times daily for 10 days. 12/12/20 12/22/20 Yes Candis Schatz, PA-C  ciprofloxacin-dexamethasone (CIPRODEX) OTIC suspension Place 4 drops into both ears 2 (two) times daily for 5 days. 12/12/20 12/17/20 Yes Candis Schatz, PA-C  clonazePAM (KLONOPIN) 0.5 MG tablet Take 0.5 mg by mouth at bedtime. 05/02/20  Yes [provider]  levonorgestrel-ethinyl estradiol (ALESSE) 0.1-20 MG-MCG tablet Take 1 tablet by mouth daily.   Yes [provider]  lithium carbonate (ESKALITH) 450 MG CR tablet Take by mouth. 11/26/18  Yes [provider]  neomycin-polymyxin-hydrocortisone (CORTISPORIN) 3.5-10000-1 OTIC suspension Place 4 drops into both ears 3 (three) times daily for 5 days. 12/12/20 12/17/20 Yes Candis Schatz, PA-C  ziprasidone (GEODON) 80 MG capsule Take 1 capsule (80 mg total) by mouth daily with supper. Patient taking differently: Take 40 mg by mouth daily with supper. 10/10/16  Yes Jimmy Footman, MD    Family History Family History  Problem Relation Age of Onset   Diabetes Mother    Diabetes Father     Social History Social History   Tobacco Use   Smoking status: Former   Smokeless tobacco: Never  Building services engineer Use: Never used  Substance Use Topics   Alcohol use: Yes    Comment: rare   Drug use: Yes    Types: Marijuana    Comment: 3-4 bowls a day  Allergies   Patient has no known allergies.   Review of Systems Review of Systems as noted above in HPI.  Other systems reviewed and found to be negative   Physical Exam Triage Vital Signs ED Triage Vitals  Enc Vitals Group     BP 12/12/20 1747 140/90     Pulse Rate 12/12/20 1747 86     Resp 12/12/20 1747 16     Temp 12/12/20 1747 98.8 F (37.1 C)     Temp Source 12/12/20 1747 Oral     SpO2 12/12/20 1747 98 %     Weight 12/12/20 1745 274 lb (124.3 kg)     Height 12/12/20 1745 5\' 4"  (1.626 m)     Head Circumference --      Peak Flow --      Pain Score 12/12/20 1745 6      Pain Loc --      Pain Edu? --      Excl. in GC? --    No data found.  Updated Vital Signs BP 140/90 (BP Location: Left Arm)   Pulse 86   Temp 98.8 F (37.1 C) (Oral)   Resp 16   Ht 5\' 4"  (1.626 m)   Wt 274 lb (124.3 kg)   LMP 11/30/2020   SpO2 98%   BMI 47.03 kg/m     Physical Exam Constitutional:      General: She is not in acute distress.    Appearance: Normal appearance. She is not ill-appearing.  HENT:     Head: Normocephalic and atraumatic.     Right Ear: Ear canal normal. Tenderness present. A middle ear effusion is present. There is no impacted cerumen. There is mastoid tenderness (mild). Tympanic membrane is erythematous and bulging.     Left Ear: Ear canal normal. Tenderness present. A middle ear effusion is present. There is no impacted cerumen. There is mastoid tenderness (mild). Tympanic membrane is erythematous and bulging.     Nose: Congestion present. No rhinorrhea.     Comments: Some maxillary sinus tenderness to palpation    Mouth/Throat:     Mouth: Mucous membranes are moist.     Pharynx: Oropharynx is clear.  Eyes:     Extraocular Movements: Extraocular movements intact.     Pupils: Pupils are equal, round, and reactive to light.  Cardiovascular:     Rate and Rhythm: Normal rate and regular rhythm.     Pulses: Normal pulses.     Heart sounds: Normal heart sounds. No murmur heard. Pulmonary:     Effort: Pulmonary effort is normal. No respiratory distress.     Breath sounds: Normal breath sounds.  Abdominal:     General: Abdomen is flat.     Palpations: Abdomen is soft.  Musculoskeletal:        General: Normal range of motion.     Cervical back: Normal range of motion and neck supple. No tenderness.  Skin:    General: Skin is warm and dry.     Capillary Refill: Capillary refill takes less than 2 seconds.  Neurological:     General: No focal deficit present.     Mental Status: She is alert and oriented to person, place, and time.  Psychiatric:         Mood and Affect: Mood normal.        Behavior: Behavior normal.     UC Treatments / Results  Labs (all labs ordered are listed, but only abnormal results are displayed) Labs Reviewed -  No data to display  EKG   Radiology No results found.  Procedures Procedures (including critical care time)  Medications Ordered in UC Medications - No data to display  Initial Impression / Assessment and Plan / UC Course  I have reviewed the triage vital signs and the nursing notes.  Pertinent labs & imaging results that were available during my care of the patient were reviewed by me and considered in my medical decision making (see chart for details).    Patient with bilateral ear infections that she reports are recurrent.  States typically she needs oral antibiotics as well as drops.  On exam both eardrums erythematous and bulging with middle ear effusions.  Some mild tragal tenderness.  Some mild mastoid tenderness.  We will give her prescription for amoxicillin as well as the Ciprodex. Final Clinical Impressions(s) / UC Diagnoses   Final diagnoses:  Recurrent acute suppurative otitis media without spontaneous rupture of tympanic membrane of both sides     Discharge Instructions      -Amoxicillin: 1 tablet twice a day for 10 days -Ciprodex eardrops: 4 drops into both ears twice a day for 5 days -Ibuprofen Tylenol as needed for pain continue your home allergy medications -Follow-up with primary care, ENT, at this clinic as needed should symptoms worsen or not improve.     ED Prescriptions     Medication Sig Dispense Auth. Provider   ciprofloxacin-dexamethasone (CIPRODEX) OTIC suspension Place 4 drops into both ears 2 (two) times daily for 5 days. 7.5 mL Candis Schatz, PA-C   amoxicillin (AMOXIL) 500 MG capsule Take 1 capsule (500 mg total) by mouth 2 (two) times daily for 10 days. 21 capsule Candis Schatz, PA-C   neomycin-polymyxin-hydrocortisone (CORTISPORIN)  3.5-10000-1 OTIC suspension Place 4 drops into both ears 3 (three) times daily for 5 days. 10 mL Candis Schatz, PA-C      PDMP not reviewed this encounter.   Candis Schatz, PA-C 12/12/20 1830   Pharmacy called asking for alternative ear drop due to patient paying out of pocket. Prescription sent for Cortisporin and recommended GoodRx.     Candis Schatz, PA-C 12/12/20 1859

## 2020-12-12 NOTE — ED Triage Notes (Signed)
Pt states she is prone to ear infections, she is currently having bilateral ear pain, uses ear buds in shower and they were loose so water got trapped in her ear.

## 2020-12-12 NOTE — Discharge Instructions (Addendum)
-  Amoxicillin: 1 tablet twice a day for 10 days -Ciprodex eardrops: 4 drops into both ears twice a day for 5 days -Ibuprofen Tylenol as needed for pain continue your home allergy medications -Follow-up with primary care, ENT, at this clinic as needed should symptoms worsen or not improve.

## 2024-01-17 ENCOUNTER — Ambulatory Visit
Admission: EM | Admit: 2024-01-17 | Discharge: 2024-01-17 | Disposition: A | Discharge status: Home / Self Care | Payer: MEDICAID | Attending: Emergency Medicine | Admitting: Emergency Medicine

## 2024-01-17 DIAGNOSIS — H66003 Acute suppurative otitis media without spontaneous rupture of ear drum, bilateral: Secondary | ICD-10-CM

## 2024-01-17 MED ORDER — AMOXICILLIN-POT CLAVULANATE 875-125 MG PO TABS: 1.0000 | ORAL_TABLET | Freq: Two times a day (BID) | ORAL | 0 refills | Status: AC

## 2024-01-17 NOTE — ED Triage Notes (Signed)
 Pt present bilateral ear pain, symptoms started over a month ago.

## 2024-01-17 NOTE — ED Provider Notes (Signed)
 [facility]    CSN: [id number] Arrival date & time: 01/17/24  1623      History   Chief Complaint Chief Complaint  Patient presents with   Otalgia    HPI Jocelyn Martin is a 38 y.o. female.   HPI  38 year old female with past medical history significant for bipolar disorder, hypertension, PTSD presents for evaluation of 2 months worth of pain in both of her ears.  She has endorses muffled hearing and occasional ringing.  No measured fever.  Patient describes a history of chronic recurrent middle ear infection.  Past Medical History:  Diagnosis Date   Bipolar disord, crnt episode mixed, severe, w psych features (HCC)    Hypertension    PTSD (post-traumatic stress disorder)     Patient Active Problem List   Diagnosis Date Noted   Bipolar disorder, current episode manic, severe with psychotic features (HCC) 10/05/2016   Noncompliance 10/04/2016   Benign essential HTN 10/20/2014   Cannabis use disorder, severe, dependence (HCC) 10/20/2014    Past Surgical History:  Procedure Laterality Date   ankle Left    INNER EAR SURGERY     TONSILECTOMY, ADENOIDECTOMY, BILATERAL MYRINGOTOMY AND TUBES      OB History   No obstetric history on file.      Home Medications    Prior to Admission medications   Medication Sig Start Date End Date Taking? Authorizing Provider  amoxicillin -clavulanate (AUGMENTIN ) 875-125 MG tablet Take 1 tablet by mouth every 12 (twelve) hours for 7 days. 01/17/24 01/24/24 Yes Bernardino Ditch, NP  clonazePAM (KLONOPIN) 0.5 MG tablet Take 0.5 mg by mouth at bedtime. 05/02/20   [provider]  levonorgestrel-ethinyl estradiol (ALESSE) 0.1-20 MG-MCG tablet Take 1 tablet by mouth daily.    [provider]  lithium  carbonate (ESKALITH ) 450 MG CR tablet Take by mouth. 11/26/18   [provider]  ziprasidone  (GEODON ) 80 MG capsule Take 1 capsule (80 mg total) by mouth daily with supper. Patient taking differently: Take 40 mg by  mouth daily with supper. 10/10/16   Ragena Odor, MD    Family History Family History  Problem Relation Age of Onset   Diabetes Mother    Diabetes Father     Social History Social History   Tobacco Use   Smoking status: Former   Smokeless tobacco: Never  Advertising account planner   Vaping status: Never Used  Substance Use Topics   Alcohol use: Yes    Comment: rare   Drug use: Yes    Types: Marijuana    Comment: 3-4 bowls a day     Allergies   Patient has no known allergies.   Review of Systems Review of Systems  Constitutional:  Negative for fever.  HENT:  Positive for ear pain, hearing loss and tinnitus. Negative for ear discharge.      Physical Exam Triage Vital Signs ED Triage Vitals  Encounter Vitals Group     BP      Girls Systolic BP Percentile      Girls Diastolic BP Percentile      Boys Systolic BP Percentile      Boys Diastolic BP Percentile      Pulse      Resp      Temp      Temp src      SpO2      Weight      Height      Head Circumference      Peak Flow  Pain Score      Pain Loc      Pain Education      Exclude from Growth Chart    No data found.  Updated Vital Signs BP 133/86 (BP Location: Left Arm)   Pulse 92   Temp 98.1 F (36.7 C) (Oral)   Resp 18   SpO2 96%   Visual Acuity Right Eye Distance:   Left Eye Distance:   Bilateral Distance:    Right Eye Near:   Left Eye Near:    Bilateral Near:     Physical Exam Vitals and nursing note reviewed.  Constitutional:      Appearance: Normal appearance. She is not ill-appearing.  HENT:     Head: Normocephalic and atraumatic.     Right Ear: Ear canal and external ear normal. There is no impacted cerumen.     Left Ear: Ear canal and external ear normal. There is no impacted cerumen.     Ears:     Comments: Bilateral tympanic membranes are highly scarred and opaque.  There is erythema to the surrounding rim as well as some mild bulging. Neurological:     Mental Status:  She is alert.      UC Treatments / Results  Labs (all labs ordered are listed, but only abnormal results are displayed) Labs Reviewed - No data to display  EKG   Radiology No results found.  Procedures Procedures (including critical care time)  Medications Ordered in UC Medications - No data to display  Initial Impression / Assessment and Plan / UC Course  I have reviewed the triage vital signs and the nursing notes.  Pertinent labs & imaging results that were available during my care of the patient were reviewed by me and considered in my medical decision making (see chart for details).   Patient is a nontoxic-appearing 38 year old female presenting for evaluation of bilateral ear pain that has been going on for the last 2 months.  She has a history of recurrent middle ear infection.  She reports that she has seen ENT in the past regarding her ear infections and that tubes have been mentioned.  On exam patient has markedly scarred tympanic membranes bilaterally.  So much so that I am unable to visualize beyond the opacity of the scarring to determine if there is any effusion present.  The surrounding rim of the tympanic membrane's is erythematous and there is some mild bulging in place.  I will treat the patient for presumptive bilateral otitis media with Augmentin  875 twice daily for 7 days.  She may use over-the-counter Tylenol  and/or ibuprofen as needed for pain.  If her symptoms not improve I will encourage her to follow back up with ENT.   Final Clinical Impressions(s) / UC Diagnoses   Final diagnoses:  Non-recurrent acute suppurative otitis media of both ears without spontaneous rupture of tympanic membranes     Discharge Instructions      Take the Augmentin  twice daily for 7 days with food for treatment of your ear infection.  Take an over-the-counter probiotic 1 hour after each dose of antibiotic to prevent diarrhea.  Use over-the-counter Tylenol  and ibuprofen as  needed for pain or fever.  Place a hot water bottle, or heating pad, underneath your pillowcase at night to help dilate up your ear and aid in pain relief as well as resolution of the infection.  Return for reevaluation for any new or worsening symptoms.      ED Prescriptions  Medication Sig Dispense Auth. Provider   amoxicillin -clavulanate (AUGMENTIN ) 875-125 MG tablet Take 1 tablet by mouth every 12 (twelve) hours for 7 days. 14 tablet Bernardino Ditch, NP      PDMP not reviewed this encounter.   Bernardino Ditch, NP 01/17/24 1652

## 2024-01-17 NOTE — Discharge Instructions (Addendum)
 Take the Augmentin twice daily for 7 days with food for treatment of your ear infection.  Take an over-the-counter probiotic 1 hour after each dose of antibiotic to prevent diarrhea.  Use over-the-counter Tylenol and ibuprofen as needed for pain or fever.  Place a hot water bottle, or heating pad, underneath your pillowcase at night to help dilate up your ear and aid in pain relief as well as resolution of the infection.  Return for reevaluation for any new or worsening symptoms.

## 2024-02-14 ENCOUNTER — Ambulatory Visit
Admission: EM | Admit: 2024-02-14 | Discharge: 2024-02-14 | Disposition: A | Discharge status: Home / Self Care | Payer: MEDICAID | Attending: Emergency Medicine | Admitting: Emergency Medicine

## 2024-02-14 ENCOUNTER — Encounter: Payer: Self-pay | Admitting: Emergency Medicine

## 2024-02-14 DIAGNOSIS — R112 Nausea with vomiting, unspecified: Secondary | ICD-10-CM | POA: Diagnosis present

## 2024-02-14 LAB — CBC WITH DIFFERENTIAL/PLATELET
Abs Immature Granulocytes: 0.06 K/uL (ref 0.00–0.07)
Basophils Absolute: 0 K/uL (ref 0.0–0.1)
Basophils Relative: 0 %
Eosinophils Absolute: 0 K/uL (ref 0.0–0.5)
Eosinophils Relative: 0 %
HCT: 43.7 % (ref 36.0–46.0)
Hemoglobin: 15.2 g/dL — ABNORMAL HIGH (ref 12.0–15.0)
Immature Granulocytes: 0 %
Lymphocytes Relative: 18 %
Lymphs Abs: 2.5 K/uL (ref 0.7–4.0)
MCH: 28.7 pg (ref 26.0–34.0)
MCHC: 34.8 g/dL (ref 30.0–36.0)
MCV: 82.6 fL (ref 80.0–100.0)
Monocytes Absolute: 0.7 K/uL (ref 0.1–1.0)
Monocytes Relative: 5 %
Neutro Abs: 10.5 K/uL — ABNORMAL HIGH (ref 1.7–7.7)
Neutrophils Relative %: 77 %
Platelets: 309 K/uL (ref 150–400)
RBC: 5.29 MIL/uL — ABNORMAL HIGH (ref 3.87–5.11)
RDW: 13.8 % (ref 11.5–15.5)
WBC: 13.7 K/uL — ABNORMAL HIGH (ref 4.0–10.5)
nRBC: 0 % (ref 0.0–0.2)

## 2024-02-14 LAB — COMPREHENSIVE METABOLIC PANEL WITH GFR
ALT: 30 U/L (ref 0–44)
AST: 28 U/L (ref 15–41)
Albumin: 4.6 g/dL (ref 3.5–5.0)
Alkaline Phosphatase: 75 U/L (ref 38–126)
Anion gap: 11 (ref 5–15)
BUN: 16 mg/dL (ref 6–20)
CO2: 25 mmol/L (ref 22–32)
Calcium: 9.2 mg/dL (ref 8.9–10.3)
Chloride: 100 mmol/L (ref 98–111)
Creatinine, Ser: 0.8 mg/dL (ref 0.44–1.00)
GFR, Estimated: >60 mL/min (ref >60)
Glucose, Bld: 121 mg/dL — ABNORMAL HIGH (ref 70–99)
Potassium: 3.3 mmol/L — ABNORMAL LOW (ref 3.5–5.1)
Sodium: 136 mmol/L (ref 135–145)
Total Bilirubin: 0.4 mg/dL (ref 0.0–1.2)
Total Protein: 8.4 g/dL — ABNORMAL HIGH (ref 6.5–8.1)

## 2024-02-14 LAB — SARS CORONAVIRUS 2 BY RT PCR: SARS Coronavirus 2 by RT PCR: NEGATIVE

## 2024-02-14 LAB — LIPASE, BLOOD: Lipase: 29 U/L (ref 11–51)

## 2024-02-14 MED ORDER — METOCLOPRAMIDE HCL 5 MG/ML IJ SOLN
10.0000 mg | Freq: Once | INTRAMUSCULAR | Status: AC
  Administered 2024-02-14: 10 mg via INTRAMUSCULAR

## 2024-02-14 MED ORDER — ONDANSETRON 8 MG PO TBDP: ORAL_TABLET | ORAL | 0 refills | Status: DC

## 2024-02-14 MED ORDER — ONDANSETRON 8 MG PO TBDP
8.0000 mg | ORAL_TABLET | Freq: Once | ORAL | Status: AC
  Administered 2024-02-14: 8 mg via ORAL

## 2024-02-14 MED ORDER — PROMETHAZINE HCL 25 MG RE SUPP: 25.0000 mg | Freq: Four times a day (QID) | RECTAL | 0 refills | Status: DC | PRN

## 2024-02-14 NOTE — Discharge Instructions (Addendum)
 Push electrolyte containing fluids like Pedialyte, liquid IV.  Take the Zofran .  It may cause constipation.  If the Zofran  does not help with the nausea or vomiting, try the Phenergan.  I am prescribing a suppository to make sure that you can keep it down.  If you do not get better or if you get worse, or for any concerns, go to immediately to the emergency department.  Your lipase is pending.  We will contact you if and only if it comes back abnormal and you need to go to the emergency department.  Go to the emergency department if you get worse, start having fevers above 100.4, or for any concerns

## 2024-02-14 NOTE — ED Triage Notes (Signed)
 Pt c/o nausea, vomiting. Started about 2 days ago. Denies fever. Pt thinks she may have food poisoning. She states she can not keep anything down but small sips of liquid.

## 2024-02-14 NOTE — ED Provider Notes (Signed)
 HPI  SUBJECTIVE:  Jocelyn Martin is a 38 y.o. female who presents with nausea, multiple episodes of nonbilious, nonbloody emesis starting yesterday.  She states that whenever she tries to eat makes her throw up.  She has vomited 3 times today.  She reports diffuse abdominal soreness from vomiting, but denies specific abdominal pain.  She reports anorexia, 8 pound unintentional weight loss over the past 2 days, decreased urine output, states that she is urinating 2-3 times a day.  No fevers, diarrhea.  Her last bowel movement was yesterday.  It was normal amount and color, and did not change her symptoms.  No recent change in medications, URI symptoms, cough, abdominal distention, known COVID exposure.  No raw undercooked foods, questionable leftovers, recent travel.  She finished antibiotics about 3 weeks ago.  The car ride over here was not painful.  She is able to tolerate small sips of water and milk.  This and lying down makes her feel better.  Symptoms are worse when she tries to eat.  She has a past medical history of hypertension, was not able to take her medications yesterday due to the vomiting.  She also has a history of bipolar, and states that she is not on lithium .  She smokes marijuana occasionally, states none recently, and states that she drinks occasionally, last drink 2 days ago.  No history of pancreatitis, abdominal surgeries, gallbladder disease, obstruction.  LMP: Started 2 days ago.  Denies possibility being pregnant.  States that she has not had intercourse in the past 2 years.   Past Medical History:  Diagnosis Date   Bipolar disord, crnt episode mixed, severe, w psych features (HCC)    Hypertension    PTSD (post-traumatic stress disorder)     Past Surgical History:  Procedure Laterality Date   ankle Left    INNER EAR SURGERY     TONSILECTOMY, ADENOIDECTOMY, BILATERAL MYRINGOTOMY AND TUBES      Family History  Problem Relation Age of Onset   Diabetes Mother     Diabetes Father     Social History   Tobacco Use   Smoking status: Former   Smokeless tobacco: Never  Advertising account planner   Vaping status: Never Used  Substance Use Topics   Alcohol use: Yes    Comment: rare   Drug use: Yes    Types: Marijuana    Comment: 3-4 bowls a day    No current facility-administered medications for this encounter.  Current Outpatient Medications:    clonazePAM (KLONOPIN) 0.5 MG tablet, Take 0.5 mg by mouth at bedtime., Disp: , Rfl:    hydrochlorothiazide (HYDRODIURIL) 25 MG tablet, Take 25 mg by mouth daily., Disp: , Rfl:    levonorgestrel-ethinyl estradiol (ALESSE) 0.1-20 MG-MCG tablet, Take 1 tablet by mouth daily., Disp: , Rfl:    Lurasidone HCl 60 MG TABS, Take 1 tablet by mouth at bedtime., Disp: , Rfl:    ondansetron  (ZOFRAN -ODT) 8 MG disintegrating tablet, 1/2- 1 tablet q 8 hr prn nausea, vomiting, Disp: 20 tablet, Rfl: 0   promethazine (PHENERGAN) 25 MG suppository, Place 1 suppository (25 mg total) rectally every 6 (six) hours as needed for nausea or vomiting., Disp: 20 each, Rfl: 0   ziprasidone  (GEODON ) 80 MG capsule, Take 1 capsule (80 mg total) by mouth daily with supper. (Patient taking differently: Take 40 mg by mouth daily with supper.), Disp: 30 capsule, Rfl: 0  No Known Allergies   ROS  As noted in HPI.   Physical Exam  BP (!) 164/109 (BP Location: Right Arm) Comment: Pt did not take her HCTZ last night because she couldnt keep food down  Pulse 84   Temp 97.9 F (36.6 C) (Oral)   Resp 18   Ht 5' 4 (1.626 m)   Wt 124.3 kg   LMP 02/12/2024 (Exact Date)   SpO2 95%   BMI 47.04 kg/m   BP Readings from Last 3 Encounters:  02/14/24 (!) 164/109  01/17/24 133/86  12/12/20 140/90    Constitutional: Well developed, well nourished, no acute distress Eyes: PERRL, EOMI, conjunctiva normal bilaterally HENT: Normocephalic, atraumatic,mucus membranes moist Respiratory: Clear to auscultation bilaterally, no rales, no wheezing, no  rhonchi Cardiovascular: Normal rate and rhythm, no murmurs, no gallops, no rubs GI: Soft, nondistended, hypoactive bowel sounds, nontender, no rebound, no guarding.  Negative Murphy.  Pressing on abdomen causes nausea, particularly in the periumbilical region. Back: no CVAT skin: No rash, skin intact Musculoskeletal: No edema, no tenderness, no deformities Neurologic: Alert & oriented x 3, CN III-XII grossly intact, no motor deficits, sensation grossly intact Psychiatric: Speech and behavior appropriate   ED Course   Medications  ondansetron  (ZOFRAN -ODT) disintegrating tablet 8 mg (8 mg Oral Given 02/14/24 1308)  metoCLOPramide (REGLAN) injection 10 mg (10 mg Intramuscular Given 02/14/24 1551)    Orders Placed This Encounter  Procedures   SARS Coronavirus 2 by RT PCR (hospital order, performed in [facility] hospital lab) *cepheid single result test* Anterior Nasal Swab    Standing Status:   Standing    Number of Occurrences:   1   Comprehensive metabolic panel    Standing Status:   Standing    Number of Occurrences:   1   Lipase, blood    Standing Status:   Standing    Number of Occurrences:   1   CBC with Differential    Standing Status:   Standing    Number of Occurrences:   1   Offer Fluids    Standing Status:   Standing    Number of Occurrences:   20   Airborne and Contact precautions    Standing Status:   Standing    Number of Occurrences:   1   Results for orders placed or performed during the hospital encounter of 02/14/24 (from the past 24 hours)  Comprehensive metabolic panel     Status: Abnormal   Collection Time: 02/14/24  2:52 PM  Result Value Ref Range   Sodium 136 135 - 145 mmol/L   Potassium 3.3 (L) 3.5 - 5.1 mmol/L   Chloride 100 98 - 111 mmol/L   CO2 25 22 - 32 mmol/L   Glucose, Bld 121 (H) 70 - 99 mg/dL   BUN 16 6 - 20 mg/dL   Creatinine, Ser 9.19 0.44 - 1.00 mg/dL   Calcium 9.2 8.9 - 89.6 mg/dL   Total Protein 8.4 (H) 6.5 - 8.1 g/dL   Albumin  4.6 3.5 - 5.0 g/dL   AST 28 15 - 41 U/L   ALT 30 0 - 44 U/L   Alkaline Phosphatase 75 38 - 126 U/L   Total Bilirubin 0.4 0.0 - 1.2 mg/dL   GFR, Estimated >39 >39 mL/min   Anion gap 11 5 - 15  CBC with Differential     Status: Abnormal   Collection Time: 02/14/24  2:52 PM  Result Value Ref Range   WBC 13.7 (H) 4.0 - 10.5 K/uL   RBC 5.29 (H) 3.87 - 5.11 MIL/uL   Hemoglobin  15.2 (H) 12.0 - 15.0 g/dL   HCT 56.2 63.9 - 53.9 %   MCV 82.6 80.0 - 100.0 fL   MCH 28.7 26.0 - 34.0 pg   MCHC 34.8 30.0 - 36.0 g/dL   RDW 86.1 88.4 - 84.4 %   Platelets 309 150 - 400 K/uL   nRBC 0.0 0.0 - 0.2 %   Neutrophils Relative % 77 %   Neutro Abs 10.5 (H) 1.7 - 7.7 K/uL   Lymphocytes Relative 18 %   Lymphs Abs 2.5 0.7 - 4.0 K/uL   Monocytes Relative 5 %   Monocytes Absolute 0.7 0.1 - 1.0 K/uL   Eosinophils Relative 0 %   Eosinophils Absolute 0.0 0.0 - 0.5 K/uL   Basophils Relative 0 %   Basophils Absolute 0.0 0.0 - 0.1 K/uL   Immature Granulocytes 0 %   Abs Immature Granulocytes 0.06 0.00 - 0.07 K/uL  SARS Coronavirus 2 by RT PCR (hospital order, performed in [facility] hospital lab) *cepheid single result test* Anterior Nasal Swab     Status: None   Collection Time: 02/14/24  2:52 PM   Specimen: Anterior Nasal Swab  Result Value Ref Range   SARS Coronavirus 2 by RT PCR NEGATIVE NEGATIVE   No results found.  ED Clinical Impression  1. Nausea and vomiting, unspecified vomiting type      ED Assessment/Plan     1.  Nausea/vomiting.  I suspect some component of dehydration/viral gastroenteritis.  Cannabis hyperemesis syndrome also in the differential.  She states that she is feeling significantly improved after the Zofran , will try p.o. challenge.  Her vitals are normal, she denies lightheadedness, dizziness, so I would like to try p.o. challenge first.  Will check COVID, CBC, CMP, lipase.  On reevaluation, patient reports persistent nausea.  She has not thrown up.  She is tolerating p.o.   Will give 10 mg of Reglan IM.  She would like to go home after this, and will go to the ER this does not improve her nausea.  Reviewed labs.  COVID-negative.  Mild hypokalemia, which I expect.  She has mild leukocytosis at 13.7 and hemoconcentration, but this could be from the stress of vomiting.  COVID-negative.  Discussed all these results with patient while in department.  Lipase pending at the time of initial signing of this note.  Home with Zofran  and Phenergan suppository 12.5 mg every 6 hours if Zofran  does not help.  Push electrolyte containing fluids.  Strict ER return precautions given.  Work note for several days.  Discussed labs, MDM, treatment plan, and plan for follow-up with patient.  Strict ER return precautions given.  patient agrees with plan.   Meds ordered this encounter  Medications   ondansetron  (ZOFRAN -ODT) disintegrating tablet 8 mg   metoCLOPramide (REGLAN) injection 10 mg   ondansetron  (ZOFRAN -ODT) 8 MG disintegrating tablet    Sig: 1/2- 1 tablet q 8 hr prn nausea, vomiting    Dispense:  20 tablet    Refill:  0   promethazine (PHENERGAN) 25 MG suppository    Sig: Place 1 suppository (25 mg total) rectally every 6 (six) hours as needed for nausea or vomiting.    Dispense:  20 each    Refill:  0      *This clinic note was created using Scientist, clinical (histocompatibility and immunogenetics). Therefore, there may be occasional mistakes despite careful proofreading. ?    Van Knee, MD 02/14/24 1935

## 2024-04-07 ENCOUNTER — Ambulatory Visit
Admission: EM | Admit: 2024-04-07 | Discharge: 2024-04-07 | Disposition: A | Discharge status: Home / Self Care | Payer: MEDICAID | Attending: Emergency Medicine | Admitting: Emergency Medicine

## 2024-04-07 ENCOUNTER — Encounter: Payer: Self-pay | Admitting: Emergency Medicine

## 2024-04-07 DIAGNOSIS — H6690 Otitis media, unspecified, unspecified ear: Secondary | ICD-10-CM

## 2024-04-07 DIAGNOSIS — M26623 Arthralgia of bilateral temporomandibular joint: Secondary | ICD-10-CM

## 2024-04-07 DIAGNOSIS — H6122 Impacted cerumen, left ear: Secondary | ICD-10-CM

## 2024-04-07 DIAGNOSIS — H9203 Otalgia, bilateral: Secondary | ICD-10-CM

## 2024-04-07 MED ORDER — FLUCONAZOLE 150 MG PO TABS: 150.0000 mg | ORAL_TABLET | Freq: Once | ORAL | 1 refills | Status: AC

## 2024-04-07 MED ORDER — AMOXICILLIN-POT CLAVULANATE 875-125 MG PO TABS: 1.0000 | ORAL_TABLET | Freq: Two times a day (BID) | ORAL | 0 refills | Status: AC

## 2024-04-07 MED ORDER — CYCLOBENZAPRINE HCL 10 MG PO TABS: 10.0000 mg | ORAL_TABLET | Freq: Every day | ORAL | 0 refills | Status: AC

## 2024-04-07 MED ORDER — IBUPROFEN 600 MG PO TABS: 600.0000 mg | ORAL_TABLET | Freq: Four times a day (QID) | ORAL | 0 refills | Status: AC | PRN

## 2024-04-07 MED ORDER — FLUTICASONE PROPIONATE 50 MCG/ACT NA SUSP: 2.0000 | Freq: Every day | NASAL | 0 refills | Status: AC

## 2024-04-07 NOTE — ED Triage Notes (Signed)
 Pt presents with bilateral ear pain x 1 month. She has not taken anything for the pain.

## 2024-04-07 NOTE — ED Provider Notes (Signed)
 HPI  SUBJECTIVE:  Jocelyn Martin is a 38 y.o. female who presents with ***    Past Medical History:  Diagnosis Date   Bipolar disord, crnt episode mixed, severe, w psych features (HCC)    Hypertension    PTSD (post-traumatic stress disorder)     Past Surgical History:  Procedure Laterality Date   ankle Left    INNER EAR SURGERY     TONSILECTOMY, ADENOIDECTOMY, BILATERAL MYRINGOTOMY AND TUBES      Family History  Problem Relation Age of Onset   Diabetes Mother    Diabetes Father     Social History   Tobacco Use   Smoking status: Former    Passive exposure: Current   Smokeless tobacco: Never  Vaping Use   Vaping status: Never Used  Substance Use Topics   Alcohol use: Yes    Comment: rare   Drug use: Not Currently    Types: Marijuana    Comment: 3-4 bowls a day    No current facility-administered medications for this encounter.  Current Outpatient Medications:    amoxicillin -clavulanate (AUGMENTIN ) 875-125 MG tablet, Take 1 tablet by mouth every 12 (twelve) hours for 7 days., Disp: 14 tablet, Rfl: 0   cyclobenzaprine  (FLEXERIL ) 10 MG tablet, Take 1 tablet (10 mg total) by mouth at bedtime., Disp: 20 tablet, Rfl: 0   fluconazole  (DIFLUCAN ) 150 MG tablet, Take 1 tablet (150 mg total) by mouth once for 1 dose. 1 tab po x 1. May repeat in 72 hours if no improvement, Disp: 2 tablet, Rfl: 1   fluticasone  (FLONASE ) 50 MCG/ACT nasal spray, Place 2 sprays into both nostrils daily., Disp: 16 g, Rfl: 0   hydrochlorothiazide (HYDRODIURIL) 25 MG tablet, Take 25 mg by mouth daily., Disp: , Rfl:    ibuprofen  (ADVIL ) 600 MG tablet, Take 1 tablet (600 mg total) by mouth every 6 (six) hours as needed., Disp: 30 tablet, Rfl: 0   clonazePAM (KLONOPIN) 0.5 MG tablet, Take 0.5 mg by mouth at bedtime., Disp: , Rfl:    levonorgestrel-ethinyl estradiol (ALESSE) 0.1-20 MG-MCG tablet, Take 1 tablet by mouth daily., Disp: , Rfl:    Lurasidone HCl 60 MG TABS, Take 1 tablet by mouth at bedtime.,  Disp: , Rfl:    risperiDONE (RISPERDAL) 2 MG tablet, Take 2 mg by mouth at bedtime., Disp: , Rfl:    ziprasidone  (GEODON ) 80 MG capsule, Take 1 capsule (80 mg total) by mouth daily with supper. (Patient taking differently: Take 40 mg by mouth daily with supper.), Disp: 30 capsule, Rfl: 0  No Known Allergies   ROS  As noted in HPI.   Physical Exam  BP (!) 145/88 (BP Location: Left Wrist)   Pulse (!) 101   Temp 98.8 F (37.1 C) (Oral)   Resp 18   Wt (!) 140.6 kg   LMP 02/12/2024   SpO2 97%   BMI 53.21 kg/m  *** Constitutional: Well developed, well nourished, no acute distress Eyes:  EOMI, conjunctiva normal bilaterally HENT: Normocephalic, atraumatic,mucus membranes moist Left ear: External ear normal.  No apparent traction pinna, palpation of mastoid.  Tenderness over the tragus.  Cerumen obscuring the TM.  Tenderness over the TMJ.  No jaw crepitus. Right ear: External ear, EAC, TM normal.  No dullness, erythema or bulging.  Tenderness over the TMJ and tragus.  No jaw crepitus. Positive nasal congestion.  No sinus tenderness. Neck: No cervical lymphadenopathy Respiratory: Normal inspiratory effort Cardiovascular: Normal rate GI: nondistended skin: No rash, skin intact Musculoskeletal:  no deformities Neurologic: Alert & oriented x 3, no focal neuro deficits Psychiatric: Speech and behavior appropriate   ED Course   Medications - No data to display  No orders of the defined types were placed in this encounter.   No results found for this or any previous visit (from the past 24 hours). No results found.  ED Clinical Impression  1. Otalgia of both ears   2. Bilateral temporomandibular joint pain   3. Hearing loss due to cerumen impaction, left   4. Subacute otitis media, unspecified otitis media type      ED Assessment/Plan   {The patient has been seen in Urgent Care in the last 3 years. :1}  Patient presents with acute illness with systemic symptoms of  tachycardia.  I am hesitant to have her ears irrigated as she has a history of chronic TM perforations.  Procedure note: After obtaining verbal consent, I attempted to curette the wax with magnification, a light and a lighted curette out of the left ear so I could visualize the TM.  I was unsuccessful.  Patient tolerated procedure well.  Suspect TMJ arthralgia bilaterally, will send home with Tylenol /ibuprofen , Flexeril , soft diet for the next 4 to 5 days.  We discussed steroids, but given her history of bipolar, we have decided to defer that today.  I cannot tell if she has an otitis media on the left side, so we will send her with Augmentin  for 7 days.  Flonase , saline nasal irrigation in case this is eustachian tube dysfunction.  She requests a prescription of Diflucan .  Advised follow-up with her PCP ASAP, so she can be referred to ENT to have the wax removed out of her left ear.  Discussed MDM, treatment plan, and plan for follow-up with patient.. patient agrees with plan.   Meds ordered this encounter  Medications   amoxicillin -clavulanate (AUGMENTIN ) 875-125 MG tablet    Sig: Take 1 tablet by mouth every 12 (twelve) hours for 7 days.    Dispense:  14 tablet    Refill:  0   ibuprofen  (ADVIL ) 600 MG tablet    Sig: Take 1 tablet (600 mg total) by mouth every 6 (six) hours as needed.    Dispense:  30 tablet    Refill:  0   fluticasone  (FLONASE ) 50 MCG/ACT nasal spray    Sig: Place 2 sprays into both nostrils daily.    Dispense:  16 g    Refill:  0   cyclobenzaprine  (FLEXERIL ) 10 MG tablet    Sig: Take 1 tablet (10 mg total) by mouth at bedtime.    Dispense:  20 tablet    Refill:  0   fluconazole  (DIFLUCAN ) 150 MG tablet    Sig: Take 1 tablet (150 mg total) by mouth once for 1 dose. 1 tab po x 1. May repeat in 72 hours if no improvement    Dispense:  2 tablet    Refill:  1      *This clinic note was created using Scientist, clinical (histocompatibility and immunogenetics). Therefore, there may be occasional  mistakes despite careful proofreading.  ?

## 2024-04-07 NOTE — Discharge Instructions (Addendum)
 Take 600 milligrams of ibuprofen , 1000 mg of Tylenol  together 3-4 times a day as needed for pain.  Flonase  may help with the nasal congestion and any eustachian tube dysfunction you may have.  Flexeril  to take at night.  This will prevent you from grinding your teeth and your sleep.  I would advise following up with a dentist for evaluation for bite guard.  Finish the Augmentin , even if you feel better.  This is for a possible infection in your left ear.  Fluconazole  combine with your Geodon  can cause an irregular heartbeat, so I would advise to hold the Geodon  if you are going to take the fluconazole .
